# Patient Record
Sex: Female | Born: 1975 | Race: White | Hispanic: No | Smoking: Current every day smoker
Health system: Southern US, Community
[De-identification: ages and names within clinical notes are randomized; demographics above are authoritative.]

## PROBLEM LIST (undated history)

## (undated) DIAGNOSIS — I10 Essential (primary) hypertension: Secondary | ICD-10-CM

## (undated) DIAGNOSIS — N289 Disorder of kidney and ureter, unspecified: Secondary | ICD-10-CM

## (undated) DIAGNOSIS — M549 Dorsalgia, unspecified: Secondary | ICD-10-CM

## (undated) DIAGNOSIS — K219 Gastro-esophageal reflux disease without esophagitis: Secondary | ICD-10-CM

## (undated) DIAGNOSIS — G8929 Other chronic pain: Secondary | ICD-10-CM

## (undated) DIAGNOSIS — K5090 Crohn's disease, unspecified, without complications: Secondary | ICD-10-CM

## (undated) HISTORY — PX: COLECTOMY: SHX59

## (undated) HISTORY — PX: TONSILLECTOMY: SUR1361

## (undated) HISTORY — PX: COLOSTOMY: SHX63

## (undated) HISTORY — PX: TUBAL LIGATION: SHX77

## (undated) HISTORY — PX: OTHER SURGICAL HISTORY: SHX169

---

## 2012-11-12 ENCOUNTER — Emergency Department (HOSPITAL_COMMUNITY)
Admission: EM | Admit: 2012-11-12 | Discharge: 2012-11-13 | Disposition: A | Discharge status: Home / Self Care | Payer: Medicare Other | Attending: Emergency Medicine | Admitting: Emergency Medicine

## 2012-11-12 ENCOUNTER — Encounter (HOSPITAL_COMMUNITY): Payer: Self-pay

## 2012-11-12 DIAGNOSIS — R059 Cough, unspecified: Secondary | ICD-10-CM | POA: Insufficient documentation

## 2012-11-12 DIAGNOSIS — I889 Nonspecific lymphadenitis, unspecified: Secondary | ICD-10-CM

## 2012-11-12 DIAGNOSIS — R599 Enlarged lymph nodes, unspecified: Secondary | ICD-10-CM | POA: Insufficient documentation

## 2012-11-12 DIAGNOSIS — G8929 Other chronic pain: Secondary | ICD-10-CM | POA: Insufficient documentation

## 2012-11-12 DIAGNOSIS — E876 Hypokalemia: Secondary | ICD-10-CM

## 2012-11-12 DIAGNOSIS — R05 Cough: Secondary | ICD-10-CM | POA: Insufficient documentation

## 2012-11-12 DIAGNOSIS — K219 Gastro-esophageal reflux disease without esophagitis: Secondary | ICD-10-CM | POA: Insufficient documentation

## 2012-11-12 DIAGNOSIS — F17200 Nicotine dependence, unspecified, uncomplicated: Secondary | ICD-10-CM | POA: Insufficient documentation

## 2012-11-12 DIAGNOSIS — Z79899 Other long term (current) drug therapy: Secondary | ICD-10-CM | POA: Insufficient documentation

## 2012-11-12 DIAGNOSIS — I10 Essential (primary) hypertension: Secondary | ICD-10-CM | POA: Insufficient documentation

## 2012-11-12 DIAGNOSIS — J029 Acute pharyngitis, unspecified: Secondary | ICD-10-CM | POA: Insufficient documentation

## 2012-11-12 DIAGNOSIS — M542 Cervicalgia: Secondary | ICD-10-CM | POA: Insufficient documentation

## 2012-11-12 DIAGNOSIS — Z8719 Personal history of other diseases of the digestive system: Secondary | ICD-10-CM | POA: Insufficient documentation

## 2012-11-12 HISTORY — DX: Gastro-esophageal reflux disease without esophagitis: K21.9

## 2012-11-12 HISTORY — DX: Essential (primary) hypertension: I10

## 2012-11-12 HISTORY — DX: Dorsalgia, unspecified: M54.9

## 2012-11-12 HISTORY — DX: Other chronic pain: G89.29

## 2012-11-12 HISTORY — DX: Crohn's disease, unspecified, without complications: K50.90

## 2012-11-12 MED ORDER — SODIUM CHLORIDE 0.9 % IV SOLN
INTRAVENOUS | Status: DC
  Administered 2012-11-12 – 2012-11-13 (×2): via INTRAVENOUS

## 2012-11-12 NOTE — ED Provider Notes (Signed)
CSN: [id number]     Arrival date & time 11/12/12  2315 History  This chart was scribed for Ward Givens, MD by Bennett Scrape, ED Scribe. This patient was seen in room APA06/APA06 and the patient's care was started at 11:40 PM.   Chief Complaint  Patient presents with  . Oral Swelling    The history is provided by the patient. No language interpreter was used.    HPI Comments: Samantha Page is a 37 y.o. female who presents to the Emergency Department complaining of 5 days of gradual onset, gradually worsening, constant neck pain that radiates into the left jaw with associated sore throat and bilateral neck swelling. She states it is painful to swallow and difficult to swallow. She denies ear pain. She was seen by her PCP in [place] for the same around 2 PM today and has a negative mono and strep test. She was advised that she was told that the symptoms were from lymphadenopathy from a possible abscess. She denies having known fevers at home, nausea, emesis and diarrhea. She reports a mild cough, but no rhinorrhea.    She is a 0.50 ppd smoker but denies alcohol use.   PCP is Dr. Merleen Milliner in [place]  Past Medical History  Diagnosis Date  . Back pain, chronic   . Crohn disease   . Hypertension   . Gastroesophageal reflux   colostomy per pt at bedside  History reviewed. No pertinent past surgical history.  Colostomy    No family history on file. History  Substance Use Topics  . Smoking status: Current Every Day Smoker  . Smokeless tobacco: Not on file  . Alcohol Use: No  she is on disability for chronic back pain, crohn's and anxiety Smokes 1/4 ppad  No OB history provided.  Review of Systems  Constitutional: Negative for fever.  HENT: Positive for sore throat and neck pain. Negative for rhinorrhea.   Respiratory: Positive for cough.   Gastrointestinal: Negative for nausea, vomiting and diarrhea.  All other systems reviewed and are negative.    Allergies  Review of  patient's allergies indicates no known allergies.  Home Medications   Current Outpatient Rx  Name  Route  Sig  Dispense  Refill  . ALPRAZolam (XANAX) 0.5 MG tablet   Oral   Take 0.5 mg by mouth at bedtime as needed for sleep.         . cetirizine (ZYRTEC) 10 MG tablet   Oral   Take 10 mg by mouth daily.         . fentaNYL (DURAGESIC - DOSED MCG/HR) 75 MCG/HR   Transdermal   Place 1 patch onto the skin every 3 (three) days.         . lansoprazole (PREVACID) 30 MG capsule   Oral   Take 30 mg by mouth daily.         . ondansetron (ZOFRAN) 4 MG tablet   Oral   Take 4 mg by mouth every 8 (eight) hours as needed for nausea.         . promethazine (PHENERGAN) 25 MG tablet   Oral   Take 25 mg by mouth every 6 (six) hours as needed for nausea.         Medication for Crohns that states with "CHOL" that she takes TID    Triage Vitals: BP 117/74  Pulse 119  Temp(Src) 99.9 F (37.7 C) (Oral)  Resp 20  Ht 5' (1.524 m)  Wt 189 lb (85.73  kg)  BMI 36.91 kg/m2  SpO2 99%   Vital signs normal except for tachycardia   Physical Exam  Nursing note and vitals reviewed. Constitutional: She is oriented to person, place, and time. She appears well-developed and well-nourished.  Non-toxic appearance. She does not appear ill. No distress.  HENT:  Head: Normocephalic and atraumatic.  Right Ear: External ear normal.  Left Ear: External ear normal.  Nose: Nose normal. No mucosal edema or rhinorrhea.  Mouth/Throat: Oropharynx is clear and moist and mucous membranes are normal. No dental abscesses or edematous.  No tonsils, no erythema, no edema, no asymmetry, normal phonation, no drooling  Eyes: Conjunctivae and EOM are normal. Pupils are equal, round, and reactive to light.  Neck: Normal range of motion and full passive range of motion without pain. Neck supple.    Bilateral swelling in the proximal anterior cervical chamber as noted with visible swelling of her neck   Cardiovascular: Normal rate, regular rhythm and normal heart sounds.  Exam reveals no gallop and no friction rub.   No murmur heard. Pulmonary/Chest: Effort normal and breath sounds normal. No respiratory distress. She has no wheezes. She has no rhonchi. She has no rales. She exhibits no tenderness and no crepitus.  Abdominal: Soft. Normal appearance and bowel sounds are normal. She exhibits no distension. There is no tenderness. There is no rebound and no guarding.  colostomy bag in place  Musculoskeletal: Normal range of motion. She exhibits no edema and no tenderness.  Moves all extremities well.   Neurological: She is alert and oriented to person, place, and time. She has normal strength. No cranial nerve deficit.  Skin: Skin is warm, dry and intact. No rash noted. No erythema. No pallor.  Psychiatric: She has a normal mood and affect. Her speech is normal and behavior is normal. Her mood appears not anxious.    ED Course   Medications  0.9 %  sodium chloride infusion ( Intravenous Stopped 11/13/12 0434)  traMADol (ULTRAM) tablet 100 mg (100 mg Oral Not Given 11/13/12 0434)  iohexol (OMNIPAQUE) 300 MG/ML solution 75 mL (75 mL Intravenous Contrast Given 11/13/12 0239)  ketorolac (TORADOL) 30 MG/ML injection 30 mg (30 mg Intravenous Given 11/13/12 0247)  clindamycin (CLEOCIN) IVPB 900 mg (0 mg Intravenous Stopped 11/13/12 0434)     DIAGNOSTIC STUDIES: Oxygen Saturation is 99% on room air, normal by my interpretation.    COORDINATION OF CARE: 11:46 PM-Discussed treatment plan which includes CT of neck with pt at bedside and pt agreed to plan.    Review of [place] dated today shows patient was switched to fentanyl on May 20. She was started on 25 mcg and has been increased up to 75 mcg per hour. Before that she was getting frequent hydrocodone and oxycodone prescriptions. She has 10 prescribing physicians and she has had her prescriptions filled from 4 pharmacies. Since she was started on  the fentanyl she has had no other narcotic prescriptions filled.  Patient states she does not feel her fentanyl is controlling her chronic back pain. She's advised to discuss this with her PCP who is managing her chronic back pain and whom she saw earlier today. We discussed the results of her CT scan and need to followup with ears nose and throat.  Discussing her low potassium, she states she is on a combination BP pill and it has been low with her Crohn's flare ups in the past. She states she can't take ibuprofen or naprosyn b/o her Crohn's and doesn't  like to take prednisone.   Pt told triage she has no allergies, once she was given her discharge papers she now states she has multiple allergies including tramadol which "Makes my head feel funny".  Procedures (including critical care time)  Results for orders placed during the hospital encounter of 11/12/12  CBC WITH DIFFERENTIAL      Result Value Range   WBC 12.9 (*) 4.0 - 10.5 K/uL   RBC 4.43  3.87 - 5.11 MIL/uL   Hemoglobin 12.1  12.0 - 15.0 g/dL   HCT 08.6  57.8 - 46.9 %   MCV 82.8  78.0 - 100.0 fL   MCH 27.3  26.0 - 34.0 pg   MCHC 33.0  30.0 - 36.0 g/dL   RDW 62.9 (*) 52.8 - 41.3 %   Platelets 300  150 - 400 K/uL   Neutrophils Relative % 72  43 - 77 %   Neutro Abs 9.2 (*) 1.7 - 7.7 K/uL   Lymphocytes Relative 20  12 - 46 %   Lymphs Abs 2.6  0.7 - 4.0 K/uL   Monocytes Relative 7  3 - 12 %   Monocytes Absolute 0.9  0.1 - 1.0 K/uL   Eosinophils Relative 1  0 - 5 %   Eosinophils Absolute 0.1  0.0 - 0.7 K/uL   Basophils Relative 0  0 - 1 %   Basophils Absolute 0.1  0.0 - 0.1 K/uL  BASIC METABOLIC PANEL      Result Value Range   Sodium 133 (*) 135 - 145 mEq/L   Potassium 2.8 (*) 3.5 - 5.1 mEq/L   Chloride 93 (*) 96 - 112 mEq/L   CO2 29  19 - 32 mEq/L   Glucose, Bld 93  70 - 99 mg/dL   BUN 14  6 - 23 mg/dL   Creatinine, Ser 2.44  0.50 - 1.10 mg/dL   Calcium 9.2  8.4 - 01.0 mg/dL   GFR calc non Af Amer >90  >90 mL/min   GFR  calc Af Amer >90  >90 mL/min    Laboratory interpretation all normal except hypokalemia, leukocytosis   Ct Soft Tissue Neck W Contrast  11/13/2012   *RADIOLOGY REPORT*  Clinical Data: Swollen Lymph nodes in neck  CT NECK WITH CONTRAST  Technique:  Multidetector CT imaging of the neck was performed with intravenous contrast.  Contrast: 75mL OMNIPAQUE IOHEXOL 300 MG/ML  SOLN  Comparison: None.  Findings: Visualized portions of the brain are unremarkable.  The orbits are normal.  The paranasal sinuses are clear.  The salivary glands, including the parotid glands and submandibular glands are normal.  The oral cavity, oropharynx, and nasopharynx are clear without evidence of loculated fluid collection or mass lesion. Well tonsils are mildly prominent.  The epiglottis is normal.  The larynx and hypopharynx are normal.  The thyroid is normal.  Multiple enlarged predominately level II lymph nodes are seen within the neck bilaterally.  The largest of level II on the right measures 1.9 cm in short axis.  The largest on the left level II measures 1 x 9 short axis (series 2, image 47).  There is surrounding soft tissue infiltration about these enlarged lymph nodes.  Enlarged right level III node is seen inferiorly measuring 1 cm in short axis (series 2, image 64).  Right level I minimal flow nodes measure up to 9 mm in short axis.  The mildly prominent level three node measures 7 mm in short axis.  These nodes are  of uncertain etiology, and may be reactive or infectious in nature.  The visualized portions of the superior mediastinum are unremarkable.  The lungs are clear.  Normal intravenous vascular enhancement seen.  No osseous abnormalities are identified.  IMPRESSION:  Enlarged right greater than left cervical adenopathy as detailed above, most prominent at level II bilaterally.  The level II nodes are heterogeneous with associated inflammatory fat stranding. These findings are uncertain etiology, and may be  reactive/infectious in nature.  No mass lesions are identified within the head and neck to suggest primary head neck cancer. Lymphoma could also also be considered, although the appearance of these nodes would be atypical for this etiology.  Correlation with laboratory values and / or possible histology may be helpful for further evaluation.  Recommend follow-up to resolution.   Original Report Authenticated By: Rise Mu, M.D.      1. Cervical lymphadenitis   2. Hypokalemia with normal acid-base balance     Discharge Medication List as of 11/13/2012  4:22 AM    START taking these medications   Details  clindamycin (CLEOCIN) 300 MG capsule Take 1 capsule (300 mg total) by mouth every 6 (six) hours., Starting 11/13/2012, Until Discontinued, Print    potassium chloride 20 MEQ TBCR Take 20 mEq by mouth 2 (two) times daily., Starting 11/13/2012, Until Discontinued, Print    traMADol (ULTRAM) 50 MG tablet Take 2 tablets (100 mg total) by mouth every 6 (six) hours as needed., Starting 11/13/2012, Until Discontinued, Print        Plan discharge   Devoria Albe, MD, FACEP   MDM   I personally performed the services described in this documentation, which was scribed in my presence. The recorded information has been reviewed and considered.  Devoria Albe, MD, FACEP    Ward Givens, MD 11/13/12 [phone]

## 2012-11-12 NOTE — ED Notes (Signed)
Pt states she saw her primary care doctor in [place] earlier today and states she was told to come to e.r. For evaluation possible peritonsillar abscess.  Pt has pain and swelling to left side of neck and throat.

## 2012-11-13 ENCOUNTER — Emergency Department (HOSPITAL_COMMUNITY): Payer: Medicare Other

## 2012-11-13 LAB — CBC WITH DIFFERENTIAL/PLATELET
Basophils Absolute: 0.1 10*3/uL (ref 0.0–0.1)
Basophils Relative: 0 % (ref 0–1)
Eosinophils Absolute: 0.1 10*3/uL (ref 0.0–0.7)
Eosinophils Relative: 1 % (ref 0–5)
HCT: 36.7 % (ref 36.0–46.0)
Hemoglobin: 12.1 g/dL (ref 12.0–15.0)
Lymphocytes Relative: 20 % (ref 12–46)
Lymphs Abs: 2.6 10*3/uL (ref 0.7–4.0)
MCH: 27.3 pg (ref 26.0–34.0)
MCHC: 33 g/dL (ref 30.0–36.0)
MCV: 82.8 fL (ref 78.0–100.0)
Monocytes Absolute: 0.9 10*3/uL (ref 0.1–1.0)
Monocytes Relative: 7 % (ref 3–12)
Neutro Abs: 9.2 10*3/uL — ABNORMAL HIGH (ref 1.7–7.7)
Neutrophils Relative %: 72 % (ref 43–77)
Platelets: 300 10*3/uL (ref 150–400)
RBC: 4.43 MIL/uL (ref 3.87–5.11)
RDW: 15.8 % — ABNORMAL HIGH (ref 11.5–15.5)
WBC: 12.9 10*3/uL — ABNORMAL HIGH (ref 4.0–10.5)

## 2012-11-13 LAB — BASIC METABOLIC PANEL
BUN: 14 mg/dL (ref 6–23)
CO2: 29 mEq/L (ref 19–32)
Calcium: 9.2 mg/dL (ref 8.4–10.5)
Chloride: 93 mEq/L — ABNORMAL LOW (ref 96–112)
Creatinine, Ser: 0.67 mg/dL (ref 0.50–1.10)
GFR calc Af Amer: >90 mL/min (ref >90)
GFR calc non Af Amer: >90 mL/min (ref >90)
Glucose, Bld: 93 mg/dL (ref 70–99)
Potassium: 2.8 mEq/L — ABNORMAL LOW (ref 3.5–5.1)
Sodium: 133 mEq/L — ABNORMAL LOW (ref 135–145)

## 2012-11-13 MED ORDER — CLINDAMYCIN HCL 300 MG PO CAPS: 300.0000 mg | ORAL_CAPSULE | Freq: Four times a day (QID) | ORAL | Status: DC

## 2012-11-13 MED ORDER — CLINDAMYCIN PHOSPHATE 900 MG/50ML IV SOLN
900.0000 mg | Freq: Once | INTRAVENOUS | Status: AC
  Administered 2012-11-13: 900 mg via INTRAVENOUS
  Filled 2012-11-13: qty 50

## 2012-11-13 MED ORDER — POTASSIUM CHLORIDE ER 20 MEQ PO TBCR: 20.0000 meq | EXTENDED_RELEASE_TABLET | Freq: Two times a day (BID) | ORAL | Status: DC

## 2012-11-13 MED ORDER — TRAMADOL HCL 50 MG PO TABS: 100.0000 mg | ORAL_TABLET | Freq: Once | ORAL | Status: DC

## 2012-11-13 MED ORDER — KETOROLAC TROMETHAMINE 30 MG/ML IJ SOLN
30.0000 mg | Freq: Once | INTRAMUSCULAR | Status: AC
  Administered 2012-11-13: 30 mg via INTRAVENOUS
  Filled 2012-11-13: qty 1

## 2012-11-13 MED ORDER — IOHEXOL 300 MG/ML  SOLN
75.0000 mL | Freq: Once | INTRAMUSCULAR | Status: AC | PRN
  Administered 2012-11-13: 75 mL via INTRAVENOUS

## 2012-11-13 MED ORDER — TRAMADOL HCL 50 MG PO TABS: 100.0000 mg | ORAL_TABLET | Freq: Four times a day (QID) | ORAL | Status: DC | PRN

## 2012-11-13 NOTE — ED Notes (Signed)
Spoke to this pt regarding her complaints about not getting anything for pain.  Pt reports E.R. Doctor was rude to her and that she did not question pt about her allergies.  Pt given phone number for Director of E.R. To voice complaints

## 2012-12-19 ENCOUNTER — Other Ambulatory Visit: Payer: Self-pay

## 2012-12-19 ENCOUNTER — Encounter (HOSPITAL_COMMUNITY): Payer: Self-pay | Admitting: Emergency Medicine

## 2012-12-19 ENCOUNTER — Emergency Department (HOSPITAL_COMMUNITY): Payer: Medicare Other

## 2012-12-19 ENCOUNTER — Emergency Department (HOSPITAL_COMMUNITY)
Admission: EM | Admit: 2012-12-19 | Discharge: 2012-12-20 | Disposition: A | Discharge status: Home / Self Care | Payer: Medicare Other | Attending: Emergency Medicine | Admitting: Emergency Medicine

## 2012-12-19 DIAGNOSIS — R05 Cough: Secondary | ICD-10-CM

## 2012-12-19 DIAGNOSIS — R0602 Shortness of breath: Secondary | ICD-10-CM | POA: Insufficient documentation

## 2012-12-19 DIAGNOSIS — F17200 Nicotine dependence, unspecified, uncomplicated: Secondary | ICD-10-CM | POA: Insufficient documentation

## 2012-12-19 DIAGNOSIS — Z87448 Personal history of other diseases of urinary system: Secondary | ICD-10-CM | POA: Insufficient documentation

## 2012-12-19 DIAGNOSIS — E876 Hypokalemia: Secondary | ICD-10-CM | POA: Insufficient documentation

## 2012-12-19 DIAGNOSIS — M546 Pain in thoracic spine: Secondary | ICD-10-CM | POA: Insufficient documentation

## 2012-12-19 DIAGNOSIS — Z8719 Personal history of other diseases of the digestive system: Secondary | ICD-10-CM | POA: Insufficient documentation

## 2012-12-19 DIAGNOSIS — I10 Essential (primary) hypertension: Secondary | ICD-10-CM | POA: Insufficient documentation

## 2012-12-19 DIAGNOSIS — R072 Precordial pain: Secondary | ICD-10-CM | POA: Insufficient documentation

## 2012-12-19 DIAGNOSIS — R059 Cough, unspecified: Secondary | ICD-10-CM | POA: Insufficient documentation

## 2012-12-19 DIAGNOSIS — Z79899 Other long term (current) drug therapy: Secondary | ICD-10-CM | POA: Insufficient documentation

## 2012-12-19 HISTORY — DX: Disorder of kidney and ureter, unspecified: N28.9

## 2012-12-19 LAB — URINE MICROSCOPIC-ADD ON

## 2012-12-19 LAB — CBC WITH DIFFERENTIAL/PLATELET
Basophils Absolute: 0 10*3/uL (ref 0.0–0.1)
Basophils Relative: 1 % (ref 0–1)
Eosinophils Absolute: 0.1 10*3/uL (ref 0.0–0.7)
Eosinophils Relative: 2 % (ref 0–5)
HCT: 39.3 % (ref 36.0–46.0)
Hemoglobin: 13.1 g/dL (ref 12.0–15.0)
Lymphocytes Relative: 23 % (ref 12–46)
Lymphs Abs: 1.2 10*3/uL (ref 0.7–4.0)
MCH: 27.8 pg (ref 26.0–34.0)
MCHC: 33.3 g/dL (ref 30.0–36.0)
MCV: 83.3 fL (ref 78.0–100.0)
Monocytes Absolute: 0.3 10*3/uL (ref 0.1–1.0)
Monocytes Relative: 5 % (ref 3–12)
Neutro Abs: 3.5 10*3/uL (ref 1.7–7.7)
Neutrophils Relative %: 69 % (ref 43–77)
Platelets: 240 10*3/uL (ref 150–400)
RBC: 4.72 MIL/uL (ref 3.87–5.11)
RDW: 16.7 % — ABNORMAL HIGH (ref 11.5–15.5)
WBC: 5 10*3/uL (ref 4.0–10.5)

## 2012-12-19 LAB — COMPREHENSIVE METABOLIC PANEL
ALT: 9 U/L (ref 0–35)
AST: 21 U/L (ref 0–37)
Albumin: 3.1 g/dL — ABNORMAL LOW (ref 3.5–5.2)
Alkaline Phosphatase: 28 U/L — ABNORMAL LOW (ref 39–117)
BUN: 10 mg/dL (ref 6–23)
CO2: 33 mEq/L — ABNORMAL HIGH (ref 19–32)
Calcium: 9 mg/dL (ref 8.4–10.5)
Chloride: 98 mEq/L (ref 96–112)
Creatinine, Ser: 0.77 mg/dL (ref 0.50–1.10)
GFR calc Af Amer: >90 mL/min (ref >90)
GFR calc non Af Amer: >90 mL/min (ref >90)
Glucose, Bld: 126 mg/dL — ABNORMAL HIGH (ref 70–99)
Potassium: 2.5 mEq/L — CL (ref 3.5–5.1)
Sodium: 136 mEq/L (ref 135–145)
Total Bilirubin: <0.1 mg/dL — ABNORMAL LOW (ref 0.3–1.2)
Total Protein: 7 g/dL (ref 6.0–8.3)

## 2012-12-19 LAB — URINALYSIS, ROUTINE W REFLEX MICROSCOPIC
Bilirubin Urine: NEGATIVE
Glucose, UA: NEGATIVE mg/dL
Ketones, ur: NEGATIVE mg/dL
Leukocytes, UA: NEGATIVE
Nitrite: NEGATIVE
Protein, ur: NEGATIVE mg/dL
Specific Gravity, Urine: 1.02 (ref 1.005–1.030)
Urobilinogen, UA: 0.2 mg/dL (ref 0.0–1.0)
pH: 7 (ref 5.0–8.0)

## 2012-12-19 LAB — D-DIMER, QUANTITATIVE (NOT AT ARMC): D-Dimer, Quant: 0.41 ug/mL-FEU (ref 0.00–0.48)

## 2012-12-19 MED ORDER — POTASSIUM CHLORIDE CRYS ER 20 MEQ PO TBCR: 20.0000 meq | EXTENDED_RELEASE_TABLET | Freq: Two times a day (BID) | ORAL | Status: DC

## 2012-12-19 MED ORDER — POTASSIUM CHLORIDE CRYS ER 20 MEQ PO TBCR
40.0000 meq | EXTENDED_RELEASE_TABLET | Freq: Once | ORAL | Status: AC
  Administered 2012-12-19: 40 meq via ORAL
  Filled 2012-12-19: qty 2

## 2012-12-19 MED ORDER — CEPHALEXIN 500 MG PO CAPS: 500.0000 mg | ORAL_CAPSULE | Freq: Four times a day (QID) | ORAL | Status: DC

## 2012-12-19 MED ORDER — ONDANSETRON HCL 4 MG PO TABS: 4.0000 mg | ORAL_TABLET | Freq: Four times a day (QID) | ORAL | Status: DC

## 2012-12-19 MED ORDER — DIAZEPAM 5 MG PO TABS
5.0000 mg | ORAL_TABLET | Freq: Once | ORAL | Status: AC
  Administered 2012-12-19: 5 mg via ORAL
  Filled 2012-12-19: qty 1

## 2012-12-19 MED ORDER — ONDANSETRON HCL 4 MG/2ML IJ SOLN
4.0000 mg | Freq: Once | INTRAMUSCULAR | Status: AC
  Administered 2012-12-19: 4 mg via INTRAVENOUS
  Filled 2012-12-19: qty 2

## 2012-12-19 MED ORDER — MORPHINE SULFATE 4 MG/ML IJ SOLN: 2.0000 mg | Freq: Once | INTRAMUSCULAR | Status: DC

## 2012-12-19 MED ORDER — MORPHINE SULFATE 4 MG/ML IJ SOLN
4.0000 mg | Freq: Once | INTRAMUSCULAR | Status: AC
  Administered 2012-12-19: 4 mg via INTRAVENOUS
  Filled 2012-12-19: qty 1

## 2012-12-19 MED ORDER — AZITHROMYCIN 250 MG PO TABS: ORAL_TABLET | ORAL | Status: DC

## 2012-12-19 MED ORDER — MORPHINE SULFATE 2 MG/ML IJ SOLN
2.0000 mg | Freq: Once | INTRAMUSCULAR | Status: AC
  Administered 2012-12-19: 2 mg via INTRAVENOUS
  Filled 2012-12-19: qty 1

## 2012-12-19 MED ORDER — SODIUM CHLORIDE 0.9 % IV SOLN
Freq: Once | INTRAVENOUS | Status: AC
  Administered 2012-12-19: 999 mL/h via INTRAVENOUS

## 2012-12-19 NOTE — ED Notes (Addendum)
CRITICAL LAB CALLED     K +  2.5  PA notified -  T. Triplett

## 2012-12-19 NOTE — ED Provider Notes (Signed)
CSN: [id number]     Arrival date & time 12/19/12  1957 History   First MD Initiated Contact with Patient 12/19/12 2003     Chief Complaint  Patient presents with  . Cough  . Chest Pain  . Back Pain   (Consider location/radiation/quality/duration/timing/severity/associated sxs/prior Treatment) HPI Comments: Laurice Iglesia is a 37 y.o. female who presents to the Emergency Department complaining of cough and congestion for 2-3 days.  States that her cough is productive of brown sputum and sometimes induces vomiting.   She also c/o mid back pain and chest tightness that began yesterday.  She also states she has some shortness of breath, but states that she smokes and it does not seem to be worse than usual.  Pain to her back is worse with movement and nothing improves the pain.  She states the pain feels similar to previous episode of pneumonia several years ago.  She denies fever, chills, persistent vomiting, abdominal pain or hemoptysis.     Patient is a 37 y.o. female presenting with cough, chest pain, and back pain. The history is provided by the patient.  Cough Cough characteristics:  Productive and vomit-inducing Sputum characteristics:  Manson Passey Severity:  Moderate Onset quality:  Gradual Duration:  2 days Timing:  Intermittent Progression:  Unchanged Chronicity:  New Smoker: yes   Relieved by:  Nothing Worsened by:  Nothing tried Ineffective treatments:  None tried Associated symptoms: chest pain and shortness of breath   Associated symptoms: no chills, no diaphoresis, no ear fullness, no ear pain, no fever, no headaches, no myalgias, no rash, no rhinorrhea, no sinus congestion, no sore throat and no wheezing   Associated symptoms comment:  Mid back pain that radiates across her back but worse on the right side Chest pain:    Chest pain quality: tightness.   Severity:  Moderate   Onset quality:  Gradual   Timing:  Constant   Progression:  Unchanged   Chronicity:  New Risk factors:  no recent infection and no recent travel   Chest Pain Pain location:  Substernal area Pain quality: tightness   Pain radiates to:  Mid back Pain radiates to the back: yes   Pain severity:  Moderate Onset quality:  Gradual Duration:  1 day Timing:  Constant Progression:  Unchanged Chronicity:  New Relieved by:  Nothing Worsened by:  Certain positions, coughing, deep breathing and movement Ineffective treatments:  None tried Associated symptoms: back pain, cough and shortness of breath   Associated symptoms: no abdominal pain, no anxiety, no diaphoresis, no dizziness, no dysphagia, no fatigue, no fever, no headache, no lower extremity edema, no nausea, no near-syncope, no numbness, no orthopnea, no syncope, not vomiting and no weakness   Cough:    Cough characteristics:  Productive   Sputum characteristics:  Manson Passey   Severity:  Moderate   Onset quality:  Gradual   Timing:  Intermittent   Progression:  Unchanged   Chronicity:  New Risk factors: smoking   Risk factors: no birth control   Back Pain Location:  Thoracic spine Quality:  Aching Radiates to: chest. Pain severity:  Moderate Pain is:  Same all the time Onset quality:  Gradual Timing:  Constant Progression:  Unchanged Context: not falling, not recent illness and not recent injury   Relieved by:  Nothing Worsened by:  Bending, twisting and movement Ineffective treatments:  None tried Associated symptoms: chest pain   Associated symptoms: no abdominal pain, no abdominal swelling, no bladder incontinence, no bowel incontinence,  no dysuria, no fever, no headaches, no leg pain, no numbness, no pelvic pain, no perianal numbness, no tingling and no weakness     Past Medical History  Diagnosis Date  . Back pain, chronic   . Crohn disease   . Hypertension   . Gastroesophageal reflux   . Renal disorder    Past Surgical History  Procedure Laterality Date  . Colectomy    . Tubal ligation    . Tonsillectomy     History  reviewed. No pertinent family history. History  Substance Use Topics  . Smoking status: Current Every Day Smoker  . Smokeless tobacco: Not on file  . Alcohol Use: No   OB History   Grav Para Term Preterm Abortions TAB SAB Ect Mult Living                 Review of Systems  Constitutional: Negative for fever, chills, diaphoresis, activity change, appetite change and fatigue.  HENT: Negative for ear pain, sore throat, rhinorrhea and trouble swallowing.   Respiratory: Positive for cough, chest tightness and shortness of breath. Negative for wheezing.   Cardiovascular: Positive for chest pain. Negative for orthopnea, syncope and near-syncope.  Gastrointestinal: Negative for nausea, vomiting, abdominal pain and bowel incontinence.  Genitourinary: Negative for bladder incontinence, dysuria and pelvic pain.  Musculoskeletal: Positive for back pain. Negative for myalgias.  Skin: Negative for rash.  Neurological: Negative for dizziness, tingling, weakness, numbness and headaches.  All other systems reviewed and are negative.    Allergies  Tramadol  Home Medications   Current Outpatient Rx  Name  Route  Sig  Dispense  Refill  . ALPRAZolam (XANAX) 0.5 MG tablet   Oral   Take 0.5 mg by mouth at bedtime as needed for sleep.         . cetirizine (ZYRTEC) 10 MG tablet   Oral   Take 10 mg by mouth daily.         . lansoprazole (PREVACID) 30 MG capsule   Oral   Take 30 mg by mouth daily.         . fentaNYL (DURAGESIC - DOSED MCG/HR) 75 MCG/HR   Transdermal   Place 1 patch onto the skin every 3 (three) days.          BP 128/62  Pulse 105  Temp(Src) 99 F (37.2 C) (Oral)  Resp 20  Ht 5' (1.524 m)  Wt 172 lb (78.019 kg)  BMI 33.59 kg/m2  SpO2 100%  LMP 12/19/2012 Physical Exam  Nursing note and vitals reviewed. Constitutional: She is oriented to person, place, and time. She appears well-developed and well-nourished. No distress.  HENT:  Head: Normocephalic and  atraumatic.  Mouth/Throat: Oropharynx is clear and moist.  Neck: Normal range of motion. Neck supple.  Cardiovascular: Normal rate, regular rhythm, normal heart sounds and intact distal pulses.   No murmur heard. Pulmonary/Chest: Effort normal and breath sounds normal. No respiratory distress. She has no wheezes. She has no rales. She exhibits no tenderness.  Abdominal: Soft. She exhibits no distension. There is no tenderness. There is no rebound and no guarding.  Patient has a colostomy bag in place  Musculoskeletal: Normal range of motion. She exhibits tenderness. She exhibits no edema.       Thoracic back: She exhibits tenderness. She exhibits normal range of motion, no swelling, no edema, no deformity, no laceration, no spasm and normal pulse.       Back:  ttp of the thoracic paraspinal muscles.  No spinal tenderness.  Patient able to sit upright on the stretcher w/o difficulty  Lymphadenopathy:    She has no cervical adenopathy.  Neurological: She is alert and oriented to person, place, and time. She exhibits normal muscle tone. Coordination normal.  Skin: Skin is warm and dry.    ED Course  Procedures (including critical care time) Labs Review Results for orders placed during the hospital encounter of 12/19/12  CBC WITH DIFFERENTIAL      Result Value Range   WBC 5.0  4.0 - 10.5 K/uL   RBC 4.72  3.87 - 5.11 MIL/uL   Hemoglobin 13.1  12.0 - 15.0 g/dL   HCT 16.1  09.6 - 04.5 %   MCV 83.3  78.0 - 100.0 fL   MCH 27.8  26.0 - 34.0 pg   MCHC 33.3  30.0 - 36.0 g/dL   RDW 40.9 (*) 81.1 - 91.4 %   Platelets 240  150 - 400 K/uL   Neutrophils Relative % 69  43 - 77 %   Neutro Abs 3.5  1.7 - 7.7 K/uL   Lymphocytes Relative 23  12 - 46 %   Lymphs Abs 1.2  0.7 - 4.0 K/uL   Monocytes Relative 5  3 - 12 %   Monocytes Absolute 0.3  0.1 - 1.0 K/uL   Eosinophils Relative 2  0 - 5 %   Eosinophils Absolute 0.1  0.0 - 0.7 K/uL   Basophils Relative 1  0 - 1 %   Basophils Absolute 0.0  0.0 -  0.1 K/uL  COMPREHENSIVE METABOLIC PANEL      Result Value Range   Sodium 136  135 - 145 mEq/L   Potassium 2.5 (*) 3.5 - 5.1 mEq/L   Chloride 98  96 - 112 mEq/L   CO2 33 (*) 19 - 32 mEq/L   Glucose, Bld 126 (*) 70 - 99 mg/dL   BUN 10  6 - 23 mg/dL   Creatinine, Ser 7.82  0.50 - 1.10 mg/dL   Calcium 9.0  8.4 - 95.6 mg/dL   Total Protein 7.0  6.0 - 8.3 g/dL   Albumin 3.1 (*) 3.5 - 5.2 g/dL   AST 21  0 - 37 U/L   ALT 9  0 - 35 U/L   Alkaline Phosphatase 28 (*) 39 - 117 U/L   Total Bilirubin <0.1 (*) 0.3 - 1.2 mg/dL   GFR calc non Af Amer >90  >90 mL/min   GFR calc Af Amer >90  >90 mL/min  URINALYSIS, ROUTINE W REFLEX MICROSCOPIC      Result Value Range   Color, Urine YELLOW  YELLOW   APPearance CLEAR  CLEAR   Specific Gravity, Urine 1.020  1.005 - 1.030   pH 7.0  5.0 - 8.0   Glucose, UA NEGATIVE  NEGATIVE mg/dL   Hgb urine dipstick LARGE (*) NEGATIVE   Bilirubin Urine NEGATIVE  NEGATIVE   Ketones, ur NEGATIVE  NEGATIVE mg/dL   Protein, ur NEGATIVE  NEGATIVE mg/dL   Urobilinogen, UA 0.2  0.0 - 1.0 mg/dL   Nitrite NEGATIVE  NEGATIVE   Leukocytes, UA NEGATIVE  NEGATIVE  D-DIMER, QUANTITATIVE      Result Value Range   D-Dimer, Quant 0.41  0.00 - 0.48 ug/mL-FEU  URINE MICROSCOPIC-ADD ON      Result Value Range   Squamous Epithelial / LPF MANY (*) RARE   WBC, UA 0-2  <3 WBC/hpf   RBC / HPF 0-2  <3 RBC/hpf  Bacteria, UA FEW (*) RARE    Imaging Review Dg Chest 2 View  12/19/2012   CLINICAL DATA:  Cough and chest pain  EXAM: CHEST  2 VIEW  COMPARISON:  July 01, 2008  FINDINGS: Lungs are clear. Heart size and pulmonary vascularity are normal. No adenopathy. No bone lesions. No pneumothorax.  IMPRESSION: No edema or consolidation.   Electronically Signed   By: Bretta Bang   On: 12/19/2012 21:25    MDM   Patient wearing a fentanyl patch to abdomen.  States the patch is due to be removed this evening. Patient is non-toxic appearing.  Mucous membranes are moist.  No coughing  during my exam.    Date: 12/19/2012  Rate: 76  Rhythm: sinus arrhythmia  QRS Axis: normal  Intervals: normal  ST/T Wave abnormalities: normal  Conduction Disutrbances:none  Narrative Interpretation:   Old EKG Reviewed: none available    EKG read by Dr. Estell Harpin.   Labs and x-ray were reviewed with the patient. Patient hx, labs and care plan discussed with Dr. Estell Harpin prior to discharge  Patient is feeling better and requesting discharge.  VSS.   Hx of hypokalemia secondary to HCTZ and chronic diarrhea, patient not compliant with taking her potassium because she states it makes her vomit.  Has appt with her PMD in [place], [place] on Friday.  I will prescribe potassium, zofran , and z-pack.  Vitals stable, she is non-toxic appearing and stable for discharge.    Micaila Ziemba L. Trisha Mangle, PA-C 12/19/12 2321

## 2012-12-19 NOTE — ED Notes (Signed)
Pt c/o upper mid back pain, chest pain, cough, and nausea x 2 days.

## 2012-12-20 NOTE — ED Provider Notes (Signed)
Medical screening examination/treatment/procedure(s) were performed by non-physician practitioner and as supervising physician I was immediately available for consultation/collaboration.   Diontay Rosencrans L Zynia Wojtowicz, MD 12/20/12 1415 

## 2013-03-06 ENCOUNTER — Emergency Department (HOSPITAL_COMMUNITY)
Admission: EM | Admit: 2013-03-06 | Discharge: 2013-03-06 | Disposition: A | Discharge status: Home / Self Care | Payer: Medicare Other | Attending: Emergency Medicine | Admitting: Emergency Medicine

## 2013-03-06 ENCOUNTER — Encounter (HOSPITAL_COMMUNITY): Payer: Self-pay | Admitting: Emergency Medicine

## 2013-03-06 DIAGNOSIS — G8929 Other chronic pain: Secondary | ICD-10-CM | POA: Insufficient documentation

## 2013-03-06 DIAGNOSIS — Z79899 Other long term (current) drug therapy: Secondary | ICD-10-CM | POA: Insufficient documentation

## 2013-03-06 DIAGNOSIS — R109 Unspecified abdominal pain: Secondary | ICD-10-CM | POA: Insufficient documentation

## 2013-03-06 DIAGNOSIS — Z9851 Tubal ligation status: Secondary | ICD-10-CM | POA: Insufficient documentation

## 2013-03-06 DIAGNOSIS — K219 Gastro-esophageal reflux disease without esophagitis: Secondary | ICD-10-CM | POA: Insufficient documentation

## 2013-03-06 DIAGNOSIS — IMO0002 Reserved for concepts with insufficient information to code with codable children: Secondary | ICD-10-CM | POA: Insufficient documentation

## 2013-03-06 DIAGNOSIS — K5090 Crohn's disease, unspecified, without complications: Secondary | ICD-10-CM | POA: Insufficient documentation

## 2013-03-06 DIAGNOSIS — Z87448 Personal history of other diseases of urinary system: Secondary | ICD-10-CM | POA: Insufficient documentation

## 2013-03-06 DIAGNOSIS — I10 Essential (primary) hypertension: Secondary | ICD-10-CM | POA: Insufficient documentation

## 2013-03-06 DIAGNOSIS — F17200 Nicotine dependence, unspecified, uncomplicated: Secondary | ICD-10-CM | POA: Insufficient documentation

## 2013-03-06 DIAGNOSIS — M5431 Sciatica, right side: Secondary | ICD-10-CM

## 2013-03-06 DIAGNOSIS — M5430 Sciatica, unspecified side: Secondary | ICD-10-CM | POA: Insufficient documentation

## 2013-03-06 DIAGNOSIS — Z3202 Encounter for pregnancy test, result negative: Secondary | ICD-10-CM | POA: Insufficient documentation

## 2013-03-06 LAB — URINALYSIS, ROUTINE W REFLEX MICROSCOPIC
Bilirubin Urine: NEGATIVE
Glucose, UA: NEGATIVE mg/dL
Hgb urine dipstick: NEGATIVE
Ketones, ur: NEGATIVE mg/dL
Nitrite: NEGATIVE
Protein, ur: NEGATIVE mg/dL
Specific Gravity, Urine: 1.015 (ref 1.005–1.030)
Urobilinogen, UA: 0.2 mg/dL (ref 0.0–1.0)
pH: 7.5 (ref 5.0–8.0)

## 2013-03-06 LAB — URINE MICROSCOPIC-ADD ON

## 2013-03-06 LAB — PREGNANCY, URINE: Preg Test, Ur: NEGATIVE

## 2013-03-06 MED ORDER — FENTANYL CITRATE 0.05 MG/ML IJ SOLN
50.0000 ug | Freq: Once | INTRAMUSCULAR | Status: AC
  Administered 2013-03-06: 50 ug via INTRAMUSCULAR
  Filled 2013-03-06: qty 2

## 2013-03-06 MED ORDER — PREDNISONE 10 MG PO TABS: 20.0000 mg | ORAL_TABLET | Freq: Every day | ORAL | Status: DC

## 2013-03-06 MED ORDER — MORPHINE SULFATE 4 MG/ML IJ SOLN
4.0000 mg | Freq: Once | INTRAMUSCULAR | Status: AC
  Administered 2013-03-06: 4 mg via INTRAMUSCULAR
  Filled 2013-03-06: qty 1

## 2013-03-06 MED ORDER — DIAZEPAM 5 MG PO TABS
10.0000 mg | ORAL_TABLET | Freq: Once | ORAL | Status: AC
  Administered 2013-03-06: 10 mg via ORAL
  Filled 2013-03-06: qty 2

## 2013-03-06 MED ORDER — HYDROCODONE-ACETAMINOPHEN 5-325 MG PO TABS: 1.0000 | ORAL_TABLET | ORAL | Status: DC | PRN
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
## 2013-03-06 NOTE — ED Notes (Signed)
Pt c/o rt lower back pain and rt hip pain since yesterday.

## 2013-03-13 NOTE — ED Provider Notes (Signed)
Medical screening examination/treatment/procedure(s) were performed by non-physician practitioner and as supervising physician I was immediately available for consultation/collaboration.  EKG Interpretation   None         Lulamae Skorupski W. Landen Breeland, MD 03/13/13 0050 

## 2013-05-03 ENCOUNTER — Encounter (HOSPITAL_COMMUNITY): Payer: Self-pay | Admitting: Emergency Medicine

## 2013-05-03 ENCOUNTER — Emergency Department (HOSPITAL_COMMUNITY)
Admission: EM | Admit: 2013-05-03 | Discharge: 2013-05-04 | Disposition: A | Discharge status: Home / Self Care | Payer: Medicare Other | Attending: Emergency Medicine | Admitting: Emergency Medicine

## 2013-05-03 ENCOUNTER — Emergency Department (HOSPITAL_COMMUNITY): Payer: Medicare Other

## 2013-05-03 DIAGNOSIS — R059 Cough, unspecified: Secondary | ICD-10-CM | POA: Insufficient documentation

## 2013-05-03 DIAGNOSIS — R109 Unspecified abdominal pain: Secondary | ICD-10-CM

## 2013-05-03 DIAGNOSIS — I10 Essential (primary) hypertension: Secondary | ICD-10-CM | POA: Insufficient documentation

## 2013-05-03 DIAGNOSIS — R21 Rash and other nonspecific skin eruption: Secondary | ICD-10-CM | POA: Insufficient documentation

## 2013-05-03 DIAGNOSIS — K5090 Crohn's disease, unspecified, without complications: Secondary | ICD-10-CM | POA: Insufficient documentation

## 2013-05-03 DIAGNOSIS — M549 Dorsalgia, unspecified: Secondary | ICD-10-CM | POA: Insufficient documentation

## 2013-05-03 DIAGNOSIS — R05 Cough: Secondary | ICD-10-CM | POA: Insufficient documentation

## 2013-05-03 DIAGNOSIS — R1012 Left upper quadrant pain: Secondary | ICD-10-CM | POA: Insufficient documentation

## 2013-05-03 DIAGNOSIS — R112 Nausea with vomiting, unspecified: Secondary | ICD-10-CM

## 2013-05-03 DIAGNOSIS — F17200 Nicotine dependence, unspecified, uncomplicated: Secondary | ICD-10-CM | POA: Insufficient documentation

## 2013-05-03 DIAGNOSIS — N289 Disorder of kidney and ureter, unspecified: Secondary | ICD-10-CM | POA: Insufficient documentation

## 2013-05-03 DIAGNOSIS — Z79899 Other long term (current) drug therapy: Secondary | ICD-10-CM | POA: Insufficient documentation

## 2013-05-03 DIAGNOSIS — Z9049 Acquired absence of other specified parts of digestive tract: Secondary | ICD-10-CM | POA: Insufficient documentation

## 2013-05-03 DIAGNOSIS — K219 Gastro-esophageal reflux disease without esophagitis: Secondary | ICD-10-CM | POA: Insufficient documentation

## 2013-05-03 DIAGNOSIS — Z9851 Tubal ligation status: Secondary | ICD-10-CM | POA: Insufficient documentation

## 2013-05-03 DIAGNOSIS — R51 Headache: Secondary | ICD-10-CM | POA: Insufficient documentation

## 2013-05-03 DIAGNOSIS — G8929 Other chronic pain: Secondary | ICD-10-CM | POA: Insufficient documentation

## 2013-05-03 LAB — CBC WITH DIFFERENTIAL/PLATELET
Basophils Absolute: 0 10*3/uL (ref 0.0–0.1)
Basophils Relative: 0 % (ref 0–1)
Eosinophils Absolute: 0.3 10*3/uL (ref 0.0–0.7)
Eosinophils Relative: 2 % (ref 0–5)
HCT: 38.8 % (ref 36.0–46.0)
Hemoglobin: 12.9 g/dL (ref 12.0–15.0)
Lymphocytes Relative: 30 % (ref 12–46)
Lymphs Abs: 3.8 10*3/uL (ref 0.7–4.0)
MCH: 28.5 pg (ref 26.0–34.0)
MCHC: 33.2 g/dL (ref 30.0–36.0)
MCV: 85.7 fL (ref 78.0–100.0)
Monocytes Absolute: 0.6 10*3/uL (ref 0.1–1.0)
Monocytes Relative: 4 % (ref 3–12)
Neutro Abs: 8.3 10*3/uL — ABNORMAL HIGH (ref 1.7–7.7)
Neutrophils Relative %: 64 % (ref 43–77)
Platelets: 332 10*3/uL (ref 150–400)
RBC: 4.53 MIL/uL (ref 3.87–5.11)
RDW: 15.9 % — ABNORMAL HIGH (ref 11.5–15.5)
WBC: 12.9 10*3/uL — ABNORMAL HIGH (ref 4.0–10.5)

## 2013-05-03 LAB — COMPREHENSIVE METABOLIC PANEL
ALT: 9 U/L (ref 0–35)
AST: 10 U/L (ref 0–37)
Albumin: 3.2 g/dL — ABNORMAL LOW (ref 3.5–5.2)
Alkaline Phosphatase: 37 U/L — ABNORMAL LOW (ref 39–117)
BUN: 16 mg/dL (ref 6–23)
CO2: 28 mEq/L (ref 19–32)
Calcium: 9 mg/dL (ref 8.4–10.5)
Chloride: 101 mEq/L (ref 96–112)
Creatinine, Ser: 0.87 mg/dL (ref 0.50–1.10)
GFR calc Af Amer: >90 mL/min (ref >90)
GFR calc non Af Amer: 84 mL/min — ABNORMAL LOW (ref >90)
Glucose, Bld: 91 mg/dL (ref 70–99)
Potassium: 3.4 mEq/L — ABNORMAL LOW (ref 3.7–5.3)
Sodium: 140 mEq/L (ref 137–147)
Total Bilirubin: <0.2 mg/dL — ABNORMAL LOW (ref 0.3–1.2)
Total Protein: 7.2 g/dL (ref 6.0–8.3)

## 2013-05-03 LAB — LIPASE, BLOOD: Lipase: 44 U/L (ref 11–59)

## 2013-05-03 MED ORDER — IOHEXOL 300 MG/ML  SOLN
50.0000 mL | Freq: Once | INTRAMUSCULAR | Status: AC | PRN
  Administered 2013-05-03: 50 mL via ORAL

## 2013-05-03 MED ORDER — ONDANSETRON HCL 4 MG/2ML IJ SOLN
4.0000 mg | Freq: Once | INTRAMUSCULAR | Status: AC
  Administered 2013-05-03: 4 mg via INTRAVENOUS
  Filled 2013-05-03: qty 2

## 2013-05-03 MED ORDER — ONDANSETRON HCL 4 MG/2ML IJ SOLN
INTRAMUSCULAR | Status: AC
  Administered 2013-05-03: 4 mg via INTRAVENOUS
  Filled 2013-05-03: qty 2

## 2013-05-03 MED ORDER — HYDROMORPHONE HCL PF 1 MG/ML IJ SOLN
1.0000 mg | Freq: Once | INTRAMUSCULAR | Status: AC
Start: 2013-05-03 — End: 2013-05-03
  Administered 2013-05-03: 1 mg via INTRAVENOUS
  Filled 2013-05-03: qty 1

## 2013-05-03 MED ORDER — SODIUM CHLORIDE 0.9 % IV SOLN: INTRAVENOUS | Status: DC

## 2013-05-03 MED ORDER — IOHEXOL 300 MG/ML  SOLN
100.0000 mL | Freq: Once | INTRAMUSCULAR | Status: AC | PRN
  Administered 2013-05-03: 100 mL via INTRAVENOUS

## 2013-05-03 MED ORDER — ONDANSETRON HCL 4 MG/2ML IJ SOLN
4.0000 mg | Freq: Once | INTRAMUSCULAR | Status: AC
  Administered 2013-05-03: 4 mg via INTRAVENOUS

## 2013-05-03 MED ORDER — PROMETHAZINE HCL 25 MG/ML IJ SOLN
12.5000 mg | Freq: Once | INTRAMUSCULAR | Status: AC
  Administered 2013-05-03: 12.5 mg via INTRAVENOUS

## 2013-05-03 MED ORDER — HYDROCODONE-ACETAMINOPHEN 5-325 MG PO TABS: 1.0000 | ORAL_TABLET | Freq: Four times a day (QID) | ORAL | Status: DC | PRN

## 2013-05-03 MED ORDER — SODIUM CHLORIDE 0.9 % IV BOLUS (SEPSIS)
500.0000 mL | Freq: Once | INTRAVENOUS | Status: AC
  Administered 2013-05-03: 500 mL via INTRAVENOUS

## 2013-05-03 MED ORDER — PROMETHAZINE HCL 25 MG/ML IJ SOLN
INTRAMUSCULAR | Status: AC
  Filled 2013-05-03: qty 1

## 2013-05-03 MED ORDER — PROMETHAZINE HCL 25 MG PO TABS: 25.0000 mg | ORAL_TABLET | Freq: Four times a day (QID) | ORAL | Status: DC | PRN

## 2013-05-03 NOTE — ED Notes (Signed)
Patient vomiting after CT scan.  Notified Dr. Deretha Emory.  Phenergan ordered.

## 2013-05-03 NOTE — Discharge Instructions (Signed)
Abdominal Pain, Women °Abdominal (stomach, pelvic, or belly) pain can be caused by many things. It is important to tell your doctor: °· The location of the pain. °· Does it come and go or is it present all the time? °· Are there things that start the pain (eating certain foods, exercise)? °· Are there other symptoms associated with the pain (fever, nausea, vomiting, diarrhea)? °All of this is helpful to know when trying to find the cause of the pain. °CAUSES  °· Stomach: virus or bacteria infection, or ulcer. °· Intestine: appendicitis (inflamed appendix), regional ileitis (Crohn's disease), ulcerative colitis (inflamed colon), irritable bowel syndrome, diverticulitis (inflamed diverticulum of the colon), or cancer of the stomach or intestine. °· Gallbladder disease or stones in the gallbladder. °· Kidney disease, kidney stones, or infection. °· Pancreas infection or cancer. °· Fibromyalgia (pain disorder). °· Diseases of the female organs: °· Uterus: fibroid (non-cancerous) tumors or infection. °· Fallopian tubes: infection or tubal pregnancy. °· Ovary: cysts or tumors. °· Pelvic adhesions (scar tissue). °· Endometriosis (uterus lining tissue growing in the pelvis and on the pelvic organs). °· Pelvic congestion syndrome (female organs filling up with blood just before the menstrual period). °· Pain with the menstrual period. °· Pain with ovulation (producing an egg). °· Pain with an IUD (intrauterine device, birth control) in the uterus. °· Cancer of the female organs. °· Functional pain (pain not caused by a disease, may improve without treatment). °· Psychological pain. °· Depression. °DIAGNOSIS  °Your doctor will decide the seriousness of your pain by doing an examination. °· Blood tests. °· X-rays. °· Ultrasound. °· CT scan (computed tomography, special type of X-ray). °· MRI (magnetic resonance imaging). °· Cultures, for infection. °· Barium enema (dye inserted in the large intestine, to better view it with  X-rays). °· Colonoscopy (looking in intestine with a lighted tube). °· Laparoscopy (minor surgery, looking in abdomen with a lighted tube). °· Major abdominal exploratory surgery (looking in abdomen with a large incision). °TREATMENT  °The treatment will depend on the cause of the pain.  °· Many cases can be observed and treated at home. °· Over-the-counter medicines recommended by your caregiver. °· Prescription medicine. °· Antibiotics, for infection. °· Birth control pills, for painful periods or for ovulation pain. °· Hormone treatment, for endometriosis. °· Nerve blocking injections. °· Physical therapy. °· Antidepressants. °· Counseling with a psychologist or psychiatrist. °· Minor or major surgery. °HOME CARE INSTRUCTIONS  °· Do not take laxatives, unless directed by your caregiver. °· Take over-the-counter pain medicine only if ordered by your caregiver. Do not take aspirin because it can cause an upset stomach or bleeding. °· Try a clear liquid diet (broth or water) as ordered by your caregiver. Slowly move to a bland diet, as tolerated, if the pain is related to the stomach or intestine. °· Have a thermometer and take your temperature several times a day, and record it. °· Bed rest and sleep, if it helps the pain. °· Avoid sexual intercourse, if it causes pain. °· Avoid stressful situations. °· Keep your follow-up appointments and tests, as your caregiver orders. °· If the pain does not go away with medicine or surgery, you may try: °· Acupuncture. °· Relaxation exercises (yoga, meditation). °· Group therapy. °· Counseling. °SEEK MEDICAL CARE IF:  °· You notice certain foods cause stomach pain. °· Your home care treatment is not helping your pain. °· You need stronger pain medicine. °· You want your IUD removed. °· You feel faint or   lightheaded.  You develop nausea and vomiting.  You develop a rash.  You are having side effects or an allergy to your medicine. SEEK IMMEDIATE MEDICAL CARE IF:   Your  pain does not go away or gets worse.  You have a fever.  Your pain is felt only in portions of the abdomen. The right side could possibly be appendicitis. The left lower portion of the abdomen could be colitis or diverticulitis.  You are passing blood in your stools (bright red or black tarry stools, with or without vomiting).  You have blood in your urine.  You develop chills, with or without a fever.  You pass out. MAKE SURE YOU:   Understand these instructions.  Will watch your condition.  Will get help right away if you are not doing well or get worse. Document Released: 01/09/2007 Document Revised: 06/06/2011 Document Reviewed: 01/29/2009 [facility] Patient Information 2014 [employer], [place].  CT scan showed no flare of your Crohn's disease. Take pain medicine as directed take antinausea medicine as directed. Return for any new or worse symptoms. Followup with your doctors in the [place] area.

## 2013-05-03 NOTE — ED Notes (Signed)
Patient reports nausea, vomiting, and abdominal pain x 3 days.

## 2013-05-03 NOTE — ED Provider Notes (Signed)
CSN: [id number]     Arrival date & time 05/03/13  1948 History  This chart was scribed for Shelda Jakes, MD by Leone Payor, ED Scribe. This patient was seen in room APA19/[facility] and the patient's care was started 8:38 PM.    Chief Complaint  Patient presents with  . Emesis  . Abdominal Pain    The history is provided by the patient. No language interpreter was used.  HPI Comments: Samantha Page is a 38 y.o. female with past medical history of Crohn's disease, GERD who presents to the Emergency Department complaining of 3 days of constant abdominal pain that moves around and radiates to the back. She reports associated nausea and vomiting. She rates the pain as 7/10 currently. She has an ileostomy and reports noticing slight blood and pus in the bag. She denies diarrhea.   Past Medical History  Diagnosis Date  . Back pain, chronic   . Crohn disease   . Hypertension   . Gastroesophageal reflux   . Renal disorder    Past Surgical History  Procedure Laterality Date  . Colectomy    . Tubal ligation    . Tonsillectomy     History reviewed. No pertinent family history. History  Substance Use Topics  . Smoking status: Current Every Day Smoker  . Smokeless tobacco: Not on file  . Alcohol Use: No   OB History   Grav Para Term Preterm Abortions TAB SAB Ect Mult Living                 Review of Systems  Constitutional: Negative for fever and chills.  HENT: Negative for rhinorrhea and sore throat.   Eyes: Negative for visual disturbance.  Respiratory: Positive for cough. Negative for shortness of breath.   Cardiovascular: Negative for chest pain and leg swelling.  Gastrointestinal: Positive for nausea, vomiting and abdominal pain. Negative for diarrhea.  Genitourinary: Negative for dysuria.  Musculoskeletal: Positive for back pain. Negative for neck pain.  Skin: Positive for rash.  Neurological: Positive for headaches.  Hematological: Does not bruise/bleed easily.   Psychiatric/Behavioral: Negative for confusion.    Allergies  Darvocet; Ibuprofen; Remicade; Tramadol; Erythromycin; and Sulfa antibiotics  Home Medications   Current Outpatient Rx  Name  Route  Sig  Dispense  Refill  . ALPRAZolam (XANAX) 0.5 MG tablet   Oral   Take 0.5 mg by mouth at bedtime as needed for sleep.         . cyclobenzaprine (FLEXERIL) 10 MG tablet   Oral   Take 10 mg by mouth 3 (three) times daily as needed for muscle spasms.         Marland Kitchen dextroamphetamine (DEXEDRINE SPANSULE) 15 MG 24 hr capsule   Oral   Take 30 mg by mouth daily.         . diphenoxylate-atropine (LOMOTIL) 2.5-0.025 MG per tablet   Oral   Take by mouth daily as needed for diarrhea or loose stools.          . lansoprazole (PREVACID) 30 MG capsule   Oral   Take 30 mg by mouth daily.         . ondansetron (ZOFRAN) 4 MG tablet   Oral   Take 1 tablet (4 mg total) by mouth every 6 (six) hours.   12 tablet   0   . potassium chloride SA (K-DUR,KLOR-CON) 20 MEQ tablet   Oral   Take 20 mEq by mouth daily.         Marland Kitchen  balsalazide (COLAZAL) 750 MG capsule   Oral   Take 2,250 mg by mouth 2 (two) times daily.         . fentaNYL (DURAGESIC - DOSED MCG/HR) 50 MCG/HR   Transdermal   Place 1 patch onto the skin every 3 (three) days.         . hydrochlorothiazide (HYDRODIURIL) 25 MG tablet   Oral   Take 25 mg by mouth daily.         Marland Kitchen. HYDROcodone-acetaminophen (NORCO/VICODIN) 5-325 MG per tablet   Oral   Take 1-2 tablets by mouth every 6 (six) hours as needed for moderate pain.   20 tablet   0   . promethazine (PHENERGAN) 25 MG tablet   Oral   Take 1 tablet (25 mg total) by mouth every 6 (six) hours as needed for nausea or vomiting.   12 tablet   1    BP 127/80  Pulse 83  Temp(Src) 98.4 F (36.9 C) (Oral)  Resp 18  Ht 5' (1.524 m)  Wt 183 lb (83.008 kg)  BMI 35.74 kg/m2  SpO2 100%  LMP 04/10/2013 Physical Exam  Nursing note and vitals reviewed. Constitutional:  She is oriented to person, place, and time. She appears well-developed and well-nourished.  HENT:  Head: Normocephalic and atraumatic.  Cardiovascular: Normal rate, regular rhythm and normal heart sounds.   Pulmonary/Chest: Effort normal and breath sounds normal. No respiratory distress. She has no wheezes. She has no rales. She exhibits no tenderness.  Abdominal: Soft. Bowel sounds are normal. She exhibits no distension. There is tenderness (Tender in LUQ).  Neurological: She is alert and oriented to person, place, and time.  Skin: Skin is warm and dry.  Psychiatric: She has a normal mood and affect.    ED Course  Procedures (including critical care time)  DIAGNOSTIC STUDIES: Oxygen Saturation is 100% on RA, normal by my interpretation.    COORDINATION OF CARE: 9:05 PM Discussed treatment plan with pt at bedside and pt agreed to plan.   Labs Review Labs Reviewed  COMPREHENSIVE METABOLIC PANEL - Abnormal; Notable for the following:    Potassium 3.4 (*)    Albumin 3.2 (*)    Alkaline Phosphatase 37 (*)    Total Bilirubin <0.2 (*)    GFR calc non Af Amer 84 (*)    All other components within normal limits  CBC WITH DIFFERENTIAL - Abnormal; Notable for the following:    WBC 12.9 (*)    RDW 15.9 (*)    Neutro Abs 8.3 (*)    All other components within normal limits  LIPASE, BLOOD   Results for orders placed during the hospital encounter of 05/03/13  COMPREHENSIVE METABOLIC PANEL      Result Value Range   Sodium 140  137 - 147 mEq/L   Potassium 3.4 (*) 3.7 - 5.3 mEq/L   Chloride 101  96 - 112 mEq/L   CO2 28  19 - 32 mEq/L   Glucose, Bld 91  70 - 99 mg/dL   BUN 16  6 - 23 mg/dL   Creatinine, Ser 4.090.87  0.50 - 1.10 mg/dL   Calcium 9.0  8.4 - 81.110.5 mg/dL   Total Protein 7.2  6.0 - 8.3 g/dL   Albumin 3.2 (*) 3.5 - 5.2 g/dL   AST 10  0 - 37 U/L   ALT 9  0 - 35 U/L   Alkaline Phosphatase 37 (*) 39 - 117 U/L   Total Bilirubin <0.2 (*) 0.3 -  1.2 mg/dL   GFR calc non Af Amer 84  (*) >90 mL/min   GFR calc Af Amer >90  >90 mL/min  LIPASE, BLOOD      Result Value Range   Lipase 44  11 - 59 U/L  CBC WITH DIFFERENTIAL      Result Value Range   WBC 12.9 (*) 4.0 - 10.5 K/uL   RBC 4.53  3.87 - 5.11 MIL/uL   Hemoglobin 12.9  12.0 - 15.0 g/dL   HCT 81.1  91.4 - 78.2 %   MCV 85.7  78.0 - 100.0 fL   MCH 28.5  26.0 - 34.0 pg   MCHC 33.2  30.0 - 36.0 g/dL   RDW 95.6 (*) 21.3 - 08.6 %   Platelets 332  150 - 400 K/uL   Neutrophils Relative % 64  43 - 77 %   Neutro Abs 8.3 (*) 1.7 - 7.7 K/uL   Lymphocytes Relative 30  12 - 46 %   Lymphs Abs 3.8  0.7 - 4.0 K/uL   Monocytes Relative 4  3 - 12 %   Monocytes Absolute 0.6  0.1 - 1.0 K/uL   Eosinophils Relative 2  0 - 5 %   Eosinophils Absolute 0.3  0.0 - 0.7 K/uL   Basophils Relative 0  0 - 1 %   Basophils Absolute 0.0  0.0 - 0.1 K/uL    Imaging Review Ct Abdomen Pelvis W Contrast  05/03/2013   CLINICAL DATA:  38 year old female with Crohn disease. Abdominal pain radiating to the back nausea and vomiting. Ileostomy with abnormal drainage. Initial encounter.  EXAM: CT ABDOMEN AND PELVIS WITH CONTRAST  TECHNIQUE: Multidetector CT imaging of the abdomen and pelvis was performed using the standard protocol following bolus administration of intravenous contrast.  CONTRAST:  42mL OMNIPAQUE IOHEXOL 300 MG/ML SOLN, OMNIPAQUE IOHEXOL 300 MG/ML SOLN  COMPARISON:  05/19/2009 and earlier.  FINDINGS: Negative lung bases.  No pericardial or pleural effusion.  No acute osseous abnormality identified.  Small volume pelvic free fluid appears to be in the cul-de-sac. 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 The lower uterine segment is indistinct. At neck is a grossly normal. Bladder completely decompressed.  Interval right abdominal ostomy. The right colon remains in place and is partially herniated into the ostomy site (series 2, image 44), or perhaps the distal ileum ostomy was created with preservation of the native terminal ileum. Gas  and stool in the residual colon. No dilated small bowel. Mildly to moderately distended stomach with contrast and food debris. No thick walled or inflamed small bowel identified.  Negative liver, gallbladder, spleen, pancreas or adrenal glands. Portal venous system within normal limits. Negative kidneys. No lymphadenopathy.  IMPRESSION: 1. Interval subtotal colectomy and right abdominal ileostomy. 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 Clinical correlation recommended. 2. No bowel obstruction. No inflamed small bowel or abdominal free fluid. 3. Indistinct lower uterine segment with small volume cul-de-sac fluid. This could be physiologic. 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 There was distal colon inflammatory bowel disease in 2011.   Electronically Signed   By: Augusto Gamble M.D.   On: 05/03/2013 23:04    EKG Interpretation   None       MDM   1. Abdominal pain   2. Nausea & vomiting    History of Crohn's disease with CT scan today shows no evidence of a an exacerbation of Crohn's no significant inflammatory response in the abdomen. Will treat for  nausea and vomiting with Phenergan and for the abdominal pain we'll treat with hydrocodone. Labs without significant abnormalities mild leukocytosis at 12.9. No significant anemia no significant electrolyte abnormalities. Patient's potassium was 3.4 is markedly improved compared with been in the past. Patient is followed in the [place] area. Patient will return for any new or worse symptoms.    I personally performed the services described in this documentation, which was scribed in my presence. The recorded information has been reviewed and is accurate.    Shelda Jakes, MD 05/03/13 [phone]

## 2013-05-04 NOTE — ED Notes (Signed)
Patient with no complaints at this time. Respirations even and unlabored. Skin warm/dry. Discharge instructions reviewed with patient at this time. Patient given opportunity to voice concerns/ask questions. IV removed per policy and band-aid applied to site. Patient discharged at this time and left Emergency Department with steady gait.  

## 2013-05-26 ENCOUNTER — Emergency Department (HOSPITAL_COMMUNITY)
Admission: EM | Admit: 2013-05-26 | Discharge: 2013-05-26 | Disposition: A | Discharge status: Home / Self Care | Payer: Medicare Other | Attending: Emergency Medicine | Admitting: Emergency Medicine

## 2013-05-26 ENCOUNTER — Emergency Department (HOSPITAL_COMMUNITY): Payer: Medicare Other

## 2013-05-26 ENCOUNTER — Encounter (HOSPITAL_COMMUNITY): Payer: Self-pay | Admitting: Emergency Medicine

## 2013-05-26 DIAGNOSIS — G8929 Other chronic pain: Secondary | ICD-10-CM | POA: Insufficient documentation

## 2013-05-26 DIAGNOSIS — K5090 Crohn's disease, unspecified, without complications: Secondary | ICD-10-CM | POA: Insufficient documentation

## 2013-05-26 DIAGNOSIS — M549 Dorsalgia, unspecified: Secondary | ICD-10-CM | POA: Insufficient documentation

## 2013-05-26 DIAGNOSIS — I10 Essential (primary) hypertension: Secondary | ICD-10-CM | POA: Insufficient documentation

## 2013-05-26 DIAGNOSIS — Z79899 Other long term (current) drug therapy: Secondary | ICD-10-CM | POA: Insufficient documentation

## 2013-05-26 DIAGNOSIS — K219 Gastro-esophageal reflux disease without esophagitis: Secondary | ICD-10-CM | POA: Insufficient documentation

## 2013-05-26 DIAGNOSIS — Z87448 Personal history of other diseases of urinary system: Secondary | ICD-10-CM | POA: Insufficient documentation

## 2013-05-26 DIAGNOSIS — F17200 Nicotine dependence, unspecified, uncomplicated: Secondary | ICD-10-CM | POA: Insufficient documentation

## 2013-05-26 DIAGNOSIS — Z3202 Encounter for pregnancy test, result negative: Secondary | ICD-10-CM | POA: Insufficient documentation

## 2013-05-26 DIAGNOSIS — N390 Urinary tract infection, site not specified: Secondary | ICD-10-CM | POA: Insufficient documentation

## 2013-05-26 DIAGNOSIS — R109 Unspecified abdominal pain: Secondary | ICD-10-CM

## 2013-05-26 LAB — COMPREHENSIVE METABOLIC PANEL
ALT: <5 U/L (ref 0–35)
AST: 7 U/L (ref 0–37)
Albumin: 3.1 g/dL — ABNORMAL LOW (ref 3.5–5.2)
Alkaline Phosphatase: 33 U/L — ABNORMAL LOW (ref 39–117)
BUN: 14 mg/dL (ref 6–23)
CO2: 25 mEq/L (ref 19–32)
Calcium: 9 mg/dL (ref 8.4–10.5)
Chloride: 102 mEq/L (ref 96–112)
Creatinine, Ser: 0.77 mg/dL (ref 0.50–1.10)
GFR calc Af Amer: >90 mL/min (ref >90)
GFR calc non Af Amer: >90 mL/min (ref >90)
Glucose, Bld: 98 mg/dL (ref 70–99)
Potassium: 3.5 mEq/L — ABNORMAL LOW (ref 3.7–5.3)
Sodium: 138 mEq/L (ref 137–147)
Total Bilirubin: 0.1 mg/dL — ABNORMAL LOW (ref 0.3–1.2)
Total Protein: 7.3 g/dL (ref 6.0–8.3)

## 2013-05-26 LAB — URINE MICROSCOPIC-ADD ON

## 2013-05-26 LAB — CBC WITH DIFFERENTIAL/PLATELET
Basophils Absolute: 0 10*3/uL (ref 0.0–0.1)
Basophils Relative: 0 % (ref 0–1)
Eosinophils Absolute: 0.2 10*3/uL (ref 0.0–0.7)
Eosinophils Relative: 2 % (ref 0–5)
HCT: 36.6 % (ref 36.0–46.0)
Hemoglobin: 12.2 g/dL (ref 12.0–15.0)
Lymphocytes Relative: 15 % (ref 12–46)
Lymphs Abs: 2 10*3/uL (ref 0.7–4.0)
MCH: 28.2 pg (ref 26.0–34.0)
MCHC: 33.3 g/dL (ref 30.0–36.0)
MCV: 84.5 fL (ref 78.0–100.0)
Monocytes Absolute: 0.6 10*3/uL (ref 0.1–1.0)
Monocytes Relative: 5 % (ref 3–12)
Neutro Abs: 10.4 10*3/uL — ABNORMAL HIGH (ref 1.7–7.7)
Neutrophils Relative %: 79 % — ABNORMAL HIGH (ref 43–77)
Platelets: 291 10*3/uL (ref 150–400)
RBC: 4.33 MIL/uL (ref 3.87–5.11)
RDW: 15.8 % — ABNORMAL HIGH (ref 11.5–15.5)
WBC: 13.3 10*3/uL — ABNORMAL HIGH (ref 4.0–10.5)

## 2013-05-26 LAB — URINALYSIS, ROUTINE W REFLEX MICROSCOPIC
Bilirubin Urine: NEGATIVE
Glucose, UA: NEGATIVE mg/dL
Ketones, ur: NEGATIVE mg/dL
Nitrite: POSITIVE — AB
Protein, ur: 100 mg/dL — AB
Specific Gravity, Urine: >1.03 — ABNORMAL HIGH (ref 1.005–1.030)
Urobilinogen, UA: 0.2 mg/dL (ref 0.0–1.0)
pH: 6 (ref 5.0–8.0)

## 2013-05-26 LAB — LIPASE, BLOOD: Lipase: 25 U/L (ref 11–59)

## 2013-05-26 LAB — PREGNANCY, URINE: Preg Test, Ur: NEGATIVE

## 2013-05-26 MED ORDER — HYDROMORPHONE HCL PF 1 MG/ML IJ SOLN
1.0000 mg | Freq: Once | INTRAMUSCULAR | Status: AC
  Administered 2013-05-26: 1 mg via INTRAVENOUS
  Filled 2013-05-26: qty 1

## 2013-05-26 MED ORDER — SODIUM CHLORIDE 0.9 % IV SOLN
1000.0000 mL | INTRAVENOUS | Status: DC
  Administered 2013-05-26: 1000 mL via INTRAVENOUS

## 2013-05-26 MED ORDER — OXYCODONE-ACETAMINOPHEN 5-325 MG PO TABS: 1.0000 | ORAL_TABLET | ORAL | Status: DC | PRN

## 2013-05-26 MED ORDER — ONDANSETRON HCL 4 MG/2ML IJ SOLN
4.0000 mg | Freq: Once | INTRAMUSCULAR | Status: AC
  Administered 2013-05-26: 4 mg via INTRAVENOUS
  Filled 2013-05-26: qty 2

## 2013-05-26 MED ORDER — CEPHALEXIN 500 MG PO CAPS: 500.0000 mg | ORAL_CAPSULE | Freq: Four times a day (QID) | ORAL | Status: DC

## 2013-05-26 MED ORDER — DEXTROSE 5 % IV SOLN
1.0000 g | Freq: Once | INTRAVENOUS | Status: AC
  Administered 2013-05-26: 1 g via INTRAVENOUS
  Filled 2013-05-26: qty 10

## 2013-05-26 MED ORDER — PHENAZOPYRIDINE HCL 100 MG PO TABS
200.0000 mg | ORAL_TABLET | Freq: Once | ORAL | Status: AC
  Administered 2013-05-26: 200 mg via ORAL
  Filled 2013-05-26: qty 2

## 2013-05-26 MED ORDER — SODIUM CHLORIDE 0.9 % IV SOLN
1000.0000 mL | Freq: Once | INTRAVENOUS | Status: AC
  Administered 2013-05-26: 1000 mL via INTRAVENOUS

## 2013-05-26 MED ORDER — ONDANSETRON HCL 4 MG PO TABS: 4.0000 mg | ORAL_TABLET | Freq: Four times a day (QID) | ORAL | Status: DC | PRN

## 2013-05-26 MED ORDER — PHENAZOPYRIDINE HCL 200 MG PO TABS: 200.0000 mg | ORAL_TABLET | Freq: Three times a day (TID) | ORAL | Status: DC | PRN

## 2013-05-26 NOTE — ED Provider Notes (Signed)
CSN: [id number]     Arrival date & time 05/26/13  0550 History   First MD Initiated Contact with Patient 05/26/13 0602     Chief Complaint  Patient presents with  . Flank Pain     (Consider location/radiation/quality/duration/timing/severity/associated sxs/prior Treatment) Patient is a 38 y.o. female presenting with flank pain. The history is provided by the patient.  Flank Pain  She has been having right flank pain for the last 4 days. Pain radiates to the right lower abdomen. There is associated urinary urgency and urinary tenesmus. She describes some discomfort when she urinates but not actual pain. There is associated nausea and vomiting. She denies fever chills. Pain is severe and she rates it at 8/10. Nothing makes it better nothing makes it worse. She does have history of kidney stones.  Past Medical History  Diagnosis Date  . Back pain, chronic   . Crohn disease   . Hypertension   . Gastroesophageal reflux   . Renal disorder    Past Surgical History  Procedure Laterality Date  . Colectomy    . Tubal ligation    . Tonsillectomy     No family history on file. History  Substance Use Topics  . Smoking status: Current Every Day Smoker  . Smokeless tobacco: Not on file  . Alcohol Use: No   OB History   Grav Para Term Preterm Abortions TAB SAB Ect Mult Living                 Review of Systems  Genitourinary: Positive for flank pain.  All other systems reviewed and are negative.      Allergies  Darvocet; Ibuprofen; Remicade; Tramadol; Erythromycin; and Sulfa antibiotics  Home Medications   Current Outpatient Rx  Name  Route  Sig  Dispense  Refill  . ALPRAZolam (XANAX) 0.5 MG tablet   Oral   Take 0.5 mg by mouth at bedtime as needed for sleep.         . balsalazide (COLAZAL) 750 MG capsule   Oral   Take 2,250 mg by mouth 2 (two) times daily.         . cyclobenzaprine (FLEXERIL) 10 MG tablet   Oral   Take 10 mg by mouth 3 (three) times daily as  needed for muscle spasms.         Marland Kitchen. dextroamphetamine (DEXEDRINE SPANSULE) 15 MG 24 hr capsule   Oral   Take 30 mg by mouth daily.         . diphenoxylate-atropine (LOMOTIL) 2.5-0.025 MG per tablet   Oral   Take by mouth daily as needed for diarrhea or loose stools.          . fentaNYL (DURAGESIC - DOSED MCG/HR) 50 MCG/HR   Transdermal   Place 1 patch onto the skin every 3 (three) days.         . hydrochlorothiazide (HYDRODIURIL) 25 MG tablet   Oral   Take 25 mg by mouth daily.         Marland Kitchen. HYDROcodone-acetaminophen (NORCO/VICODIN) 5-325 MG per tablet   Oral   Take 1-2 tablets by mouth every 6 (six) hours as needed for moderate pain.   20 tablet   0   . lansoprazole (PREVACID) 30 MG capsule   Oral   Take 30 mg by mouth daily.         . ondansetron (ZOFRAN) 4 MG tablet   Oral   Take 1 tablet (4 mg total) by mouth every 6 (  six) hours.   12 tablet   0   . potassium chloride SA (K-DUR,KLOR-CON) 20 MEQ tablet   Oral   Take 20 mEq by mouth daily.         . promethazine (PHENERGAN) 25 MG tablet   Oral   Take 1 tablet (25 mg total) by mouth every 6 (six) hours as needed for nausea or vomiting.   12 tablet   1    BP 136/90  Pulse 103  Temp(Src) 98.4 F (36.9 C) (Oral)  Resp 16  Ht 5' (1.524 m)  Wt 199 lb (90.266 kg)  BMI 38.86 kg/m2  SpO2 99%  LMP 04/10/2013 Physical Exam  Nursing note and vitals reviewed.  38 year old female, resting comfortably and in no acute distress. Vital signs are significant for tachycardia with heart rate 103. Oxygen saturation is 99%, which is normal. Head is normocephalic and atraumatic. PERRLA, EOMI. Oropharynx is clear. Neck is nontender and supple without adenopathy or JVD. Back is nontender in the midline. There is moderate right CVA tenderness. Lungs are clear without rales, wheezes, or rhonchi. Chest is nontender. Heart has regular rate and rhythm without murmur. Abdomen is soft, flat, with mild to moderate right  lower quadrant tenderness. There is no rebound or guarding. Colostomy is present and draining normally. There are no masses or hepatosplenomegaly and peristalsis is hypoactive. Extremities have no cyanosis or edema, full range of motion is present. Skin is warm and dry without rash. Neurologic: Mental status is normal, cranial nerves are intact, there are no motor or sensory deficits.  ED Course  Procedures (including critical care time) Labs Review Results for orders placed during the hospital encounter of 05/26/13  URINALYSIS, ROUTINE W REFLEX MICROSCOPIC      Result Value Ref Range   Color, Urine YELLOW  YELLOW   APPearance HAZY (*) CLEAR   Specific Gravity, Urine >1.030 (*) 1.005 - 1.030   pH 6.0  5.0 - 8.0   Glucose, UA NEGATIVE  NEGATIVE mg/dL   Hgb urine dipstick MODERATE (*) NEGATIVE   Bilirubin Urine NEGATIVE  NEGATIVE   Ketones, ur NEGATIVE  NEGATIVE mg/dL   Protein, ur 361 (*) NEGATIVE mg/dL   Urobilinogen, UA 0.2  0.0 - 1.0 mg/dL   Nitrite POSITIVE (*) NEGATIVE   Leukocytes, UA SMALL (*) NEGATIVE  PREGNANCY, URINE      Result Value Ref Range   Preg Test, Ur NEGATIVE  NEGATIVE  CBC WITH DIFFERENTIAL      Result Value Ref Range   WBC 13.3 (*) 4.0 - 10.5 K/uL   RBC 4.33  3.87 - 5.11 MIL/uL   Hemoglobin 12.2  12.0 - 15.0 g/dL   HCT 22.4  49.7 - 53.0 %   MCV 84.5  78.0 - 100.0 fL   MCH 28.2  26.0 - 34.0 pg   MCHC 33.3  30.0 - 36.0 g/dL   RDW 05.1 (*) 10.2 - 11.1 %   Platelets 291  150 - 400 K/uL   Neutrophils Relative % 79 (*) 43 - 77 %   Neutro Abs 10.4 (*) 1.7 - 7.7 K/uL   Lymphocytes Relative 15  12 - 46 %   Lymphs Abs 2.0  0.7 - 4.0 K/uL   Monocytes Relative 5  3 - 12 %   Monocytes Absolute 0.6  0.1 - 1.0 K/uL   Eosinophils Relative 2  0 - 5 %   Eosinophils Absolute 0.2  0.0 - 0.7 K/uL   Basophils Relative 0  0 - 1 %   Basophils Absolute 0.0  0.0 - 0.1 K/uL  COMPREHENSIVE METABOLIC PANEL      Result Value Ref Range   Sodium 138  137 - 147 mEq/L   Potassium  3.5 (*) 3.7 - 5.3 mEq/L   Chloride 102  96 - 112 mEq/L   CO2 25  19 - 32 mEq/L   Glucose, Bld 98  70 - 99 mg/dL   BUN 14  6 - 23 mg/dL   Creatinine, Ser 1.61  0.50 - 1.10 mg/dL   Calcium 9.0  8.4 - 09.6 mg/dL   Total Protein 7.3  6.0 - 8.3 g/dL   Albumin 3.1 (*) 3.5 - 5.2 g/dL   AST 7  0 - 37 U/L   ALT <5  0 - 35 U/L   Alkaline Phosphatase 33 (*) 39 - 117 U/L   Total Bilirubin 0.1 (*) 0.3 - 1.2 mg/dL   GFR calc non Af Amer >90  >90 mL/min   GFR calc Af Amer >90  >90 mL/min  LIPASE, BLOOD      Result Value Ref Range   Lipase 25  11 - 59 U/L  URINE MICROSCOPIC-ADD ON      Result Value Ref Range   Squamous Epithelial / LPF FEW (*) RARE   WBC, UA 7-10  <3 WBC/hpf   RBC / HPF TOO NUMEROUS TO COUNT  <3 RBC/hpf   Bacteria, UA FEW (*) RARE   Imaging Review Ct Abdomen Pelvis Wo Contrast  05/26/2013   CLINICAL DATA:  Intermittent right flank pain and dysuria since Thursday. White discharge with urination. Chronic back pain. Crohn's disease.  EXAM: CT ABDOMEN AND PELVIS WITHOUT CONTRAST  TECHNIQUE: Multidetector CT imaging of the abdomen and pelvis was performed following the standard protocol without intravenous contrast.  COMPARISON:  CT ABD - PELV W/ CM dated 05/03/2013  FINDINGS: Lower Chest: Subsegmental atelectasis at the lung bases. Normal heart size without pericardial or pleural effusion.  Abdomen/Pelvis: Normal noncontrast appearance of the liver, spleen, stomach, pancreas, gallbladder, biliary tract, adrenal glands.  No renal calculi or hydronephrosis. No hydroureter or ureteric calculi. Subtle edema about the right renal pelvis suspected on image 40/series 2. No retroperitoneal or retrocrural adenopathy.  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 Example image 45. Normal terminal ileum. No bowel obstruction. No ascites.  No pelvic adenopathy. Edema surrounding the urinary bladder on image 79/series  2. Rectal stump. Facial thickening is also identified about the posterior pelvis, including on image 77/series 2. Otherwise normal appearance of the uterus. No adnexal mass. Trace cul-de-sac fluid on image 77 is similar to on the prior exam and nonspecific.  Bones/Musculoskeletal:  No acute osseous abnormality.  IMPRESSION: 1. No urinary tract calculi or hydronephrosis. Otherwise, low sensitivity exam secondary to stone study technique. 2. Pelvic edema, favored to be centered about the urinary bladder. Favor cystitis. Subtle edema about the right renal pelvis, for which ascending infection cannot be excluded. 3. Left hemicolectomy with right-sided colostomy. Similar stomal redundancy. 4. Mild edema throughout the remainder of the pelvis, nonspecific. Can't exclude a component of pelvic inflammatory disease.   Electronically Signed   By: Jeronimo Greaves M.D.   On: 05/26/2013 07:43   Images viewed by me.  MDM   Final diagnoses:  None    Right flank pain which could be from the ureteral colic or urinary tract infection. Urinalysis will be sent and she will be sent  for CT of abdomen and pelvis. IV fluids will be given and she will be given ondansetron for nausea and hydromorphone for pain.    Dione Booze, MD 05/26/13 [phone]

## 2013-05-26 NOTE — ED Notes (Signed)
Has had intermittent R flank pain and dysuria since Thursday.  Describes as having a pressure sensation and white discharge and decreased urine output when she urinates.

## 2013-05-26 NOTE — Discharge Instructions (Signed)
Urinary Tract Infection Urinary tract infections (UTIs) can develop anywhere along your urinary tract. Your urinary tract is your body's drainage system for removing wastes and extra water. Your urinary tract includes two kidneys, two ureters, a bladder, and a urethra. Your kidneys are a pair of bean-shaped organs. Each kidney is about the size of your fist. They are located below your ribs, one on each side of your spine. CAUSES Infections are caused by microbes, which are microscopic organisms, including fungi, viruses, and bacteria. These organisms are so small that they can only be seen through a microscope. Bacteria are the microbes that most commonly cause UTIs. SYMPTOMS  Symptoms of UTIs may vary by age and gender of the patient and by the location of the infection. Symptoms in young women typically include a frequent and intense urge to urinate and a painful, burning feeling in the bladder or urethra during urination. Older women and men are more likely to be tired, shaky, and weak and have muscle aches and abdominal pain. A fever may mean the infection is in your kidneys. Other symptoms of a kidney infection include pain in your back or sides below the ribs, nausea, and vomiting. DIAGNOSIS To diagnose a UTI, your caregiver will ask you about your symptoms. Your caregiver also will ask to provide a urine sample. The urine sample will be tested for bacteria and white blood cells. White blood cells are made by your body to help fight infection. TREATMENT  Typically, UTIs can be treated with medication. Because most UTIs are caused by a bacterial infection, they usually can be treated with the use of antibiotics. The choice of antibiotic and length of treatment depend on your symptoms and the type of bacteria causing your infection. HOME CARE INSTRUCTIONS  If you were prescribed antibiotics, take them exactly as your caregiver instructs you. Finish the medication even if you feel better after you  have only taken some of the medication.  Drink enough water and fluids to keep your urine clear or pale yellow.  Avoid caffeine, tea, and carbonated beverages. They tend to irritate your bladder.  Empty your bladder often. Avoid holding urine for long periods of time.  Empty your bladder before and after sexual intercourse.  After a bowel movement, women should cleanse from front to back. Use each tissue only once. SEEK MEDICAL CARE IF:   You have back pain.  You develop a fever.  Your symptoms do not begin to resolve within 3 days. SEEK IMMEDIATE MEDICAL CARE IF:   You have severe back pain or lower abdominal pain.  You develop chills.  You have nausea or vomiting.  You have continued burning or discomfort with urination. MAKE SURE YOU:   Understand these instructions.  Will watch your condition.  Will get help right away if you are not doing well or get worse. Document Released: 12/22/2004 Document Revised: 09/13/2011 Document Reviewed: 04/22/2011 [facility] Patient Information 2014 [place].  Cephalexin tablets or capsules What is this medicine? CEPHALEXIN (sef a LEX in) is a cephalosporin antibiotic. It is used to treat certain kinds of bacterial infections It will not work for colds, flu, or other viral infections. This medicine may be used for other purposes; ask your health care provider or pharmacist if you have questions. COMMON BRAND NAME(S): Biocef, Keflex, Keftab What should I tell my health care provider before I take this medicine? They need to know if you have any of these conditions: -kidney disease -stomach or intestine problems, especially colitis -  an unusual or allergic reaction to cephalexin, other cephalosporins, penicillins, other antibiotics, medicines, foods, dyes or preservatives -pregnant or trying to get pregnant -breast-feeding How should I use this medicine? Take this medicine by mouth with a full glass of water. Follow the  directions on the prescription label. This medicine can be taken with or without food. Take your medicine at regular intervals. Do not take your medicine more often than directed. Take all of your medicine as directed even if you think you are better. Do not skip doses or stop your medicine early. Talk to your pediatrician regarding the use of this medicine in children. While this drug may be prescribed for selected conditions, precautions do apply. Overdosage: If you think you have taken too much of this medicine contact a poison control center or emergency room at once. NOTE: This medicine is only for you. Do not share this medicine with others. What if I miss a dose? If you miss a dose, take it as soon as you can. If it is almost time for your next dose, take only that dose. Do not take double or extra doses. There should be at least 4 to 6 hours between doses. What may interact with this medicine? -probenecid -some other antibiotics This list may not describe all possible interactions. Give your health care provider a list of all the medicines, herbs, non-prescription drugs, or dietary supplements you use. Also tell them if you smoke, drink alcohol, or use illegal drugs. Some items may interact with your medicine. What should I watch for while using this medicine? Tell your doctor or health care professional if your symptoms do not begin to improve in a few days. Do not treat diarrhea with over the counter products. Contact your doctor if you have diarrhea that lasts more than 2 days or if it is severe and watery. If you have diabetes, you may get a false-positive result for sugar in your urine. Check with your doctor or health care professional. What side effects may I notice from receiving this medicine? Side effects that you should report to your doctor or health care professional as soon as possible: -allergic reactions like skin rash, itching or hives, swelling of the face, lips, or  tongue -breathing problems -pain or trouble passing urine -redness, blistering, peeling or loosening of the skin, including inside the mouth -severe or watery diarrhea -unusually weak or tired -yellowing of the eyes, skin Side effects that usually do not require medical attention (report to your doctor or health care professional if they continue or are bothersome): -gas or heartburn -genital or anal irritation -headache -joint or muscle pain -nausea, vomiting This list may not describe all possible side effects. Call your doctor for medical advice about side effects. You may report side effects to FDA at 1-800-FDA-1088. Where should I keep my medicine? Keep out of the reach of children. Store at room temperature between 59 and 86 degrees F (15 and 30 degrees C). Throw away any unused medicine after the expiration date. NOTE: This sheet is a summary. It may not cover all possible information. If you have questions about this medicine, talk to your doctor, pharmacist, or health care provider.  2014, [employer]. (2007-06-18 17:09:13)  Acetaminophen; Oxycodone tablets What is this medicine? ACETAMINOPHEN; OXYCODONE (a set a MEE noe fen; ox i KOE done) is a pain reliever. It is used to treat mild to moderate pain. This medicine may be used for other purposes; ask your health care provider or pharmacist  if you have questions. COMMON BRAND NAME(S): Endocet, Magnacet, Narvox, Percocet, Perloxx, Primalev, Primlev, Roxicet, Xolox What should I tell my health care provider before I take this medicine? They need to know if you have any of these conditions: -brain tumor -Crohn's disease, inflammatory bowel disease, or ulcerative colitis -drug abuse or addiction -head injury -heart or circulation problems -if you often drink alcohol -kidney disease or problems going to the bathroom -liver disease -lung disease, asthma, or breathing problems -an unusual or allergic reaction to  acetaminophen, oxycodone, other opioid analgesics, other medicines, foods, dyes, or preservatives -pregnant or trying to get pregnant -breast-feeding How should I use this medicine? Take this medicine by mouth with a full glass of water. Follow the directions on the prescription label. Take your medicine at regular intervals. Do not take your medicine more often than directed. Talk to your pediatrician regarding the use of this medicine in children. Special care may be needed. Patients over 41 years old may have a stronger reaction and need a smaller dose. Overdosage: If you think you have taken too much of this medicine contact a poison control center or emergency room at once. NOTE: This medicine is only for you. Do not share this medicine with others. What if I miss a dose? If you miss a dose, take it as soon as you can. If it is almost time for your next dose, take only that dose. Do not take double or extra doses. What may interact with this medicine? -alcohol -antihistamines -barbiturates like amobarbital, butalbital, butabarbital, methohexital, pentobarbital, phenobarbital, thiopental, and secobarbital -benztropine -drugs for bladder problems like solifenacin, trospium, oxybutynin, tolterodine, hyoscyamine, and methscopolamine -drugs for breathing problems like ipratropium and tiotropium -drugs for certain stomach or intestine problems like propantheline, homatropine methylbromide, glycopyrrolate, atropine, belladonna, and dicyclomine -general anesthetics like etomidate, ketamine, nitrous oxide, propofol, desflurane, enflurane, halothane, isoflurane, and sevoflurane -medicines for depression, anxiety, or psychotic disturbances -medicines for sleep -muscle relaxants -naltrexone -narcotic medicines (opiates) for pain -phenothiazines like perphenazine, thioridazine, chlorpromazine, mesoridazine, fluphenazine, prochlorperazine, promazine, and  trifluoperazine -scopolamine -tramadol -trihexyphenidyl This list may not describe all possible interactions. Give your health care provider a list of all the medicines, herbs, non-prescription drugs, or dietary supplements you use. Also tell them if you smoke, drink alcohol, or use illegal drugs. Some items may interact with your medicine. What should I watch for while using this medicine? Tell your doctor or health care professional if your pain does not go away, if it gets worse, or if you have new or a different type of pain. You may develop tolerance to the medicine. Tolerance means that you will need a higher dose of the medication for pain relief. Tolerance is normal and is expected if you take this medicine for a long time. Do not suddenly stop taking your medicine because you may develop a severe reaction. Your body becomes used to the medicine. This does NOT mean you are addicted. Addiction is a behavior related to getting and using a drug for a non-medical reason. If you have pain, you have a medical reason to take pain medicine. Your doctor will tell you how much medicine to take. If your doctor wants you to stop the medicine, the dose will be slowly lowered over time to avoid any side effects. You may get drowsy or dizzy. Do not drive, use machinery, or do anything that needs mental alertness until you know how this medicine affects you. Do not stand or sit up quickly, especially if you are  an older patient. This reduces the risk of dizzy or fainting spells. Alcohol may interfere with the effect of this medicine. Avoid alcoholic drinks. There are different types of narcotic medicines (opiates) for pain. If you take more than one type at the same time, you may have more side effects. Give your health care provider a list of all medicines you use. Your doctor will tell you how much medicine to take. Do not take more medicine than directed. Call emergency for help if you have problems  breathing. The medicine will cause constipation. Try to have a bowel movement at least every 2 to 3 days. If you do not have a bowel movement for 3 days, call your doctor or health care professional. Do not take Tylenol (acetaminophen) or medicines that have acetaminophen with this medicine. Too much acetaminophen can be very dangerous. Many nonprescription medicines contain acetaminophen. Always read the labels carefully to avoid taking more acetaminophen. What side effects may I notice from receiving this medicine? Side effects that you should report to your doctor or health care professional as soon as possible: -allergic reactions like skin rash, itching or hives, swelling of the face, lips, or tongue -breathing difficulties, wheezing -confusion -light headedness or fainting spells -severe stomach pain -unusually weak or tired -yellowing of the skin or the whites of the eyes  Side effects that usually do not require medical attention (report to your doctor or health care professional if they continue or are bothersome): -dizziness -drowsiness -nausea -vomiting This list may not describe all possible side effects. Call your doctor for medical advice about side effects. You may report side effects to FDA at 1-800-FDA-1088. Where should I keep my medicine? Keep out of the reach of children. This medicine can be abused. Keep your medicine in a safe place to protect it from theft. Do not share this medicine with anyone. Selling or giving away this medicine is dangerous and against the law. Store at room temperature between 20 and 25 degrees C (68 and 77 degrees F). Keep container tightly closed. Protect from light. This medicine may cause accidental overdose and death if it is taken by other adults, children, or pets. Flush any unused medicine down the toilet to reduce the chance of harm. Do not use the medicine after the expiration date. NOTE: This sheet is a summary. It may not cover all  possible information. If you have questions about this medicine, talk to your doctor, pharmacist, or health care provider.  2014, [employer] Standard. (2012-11-05 13:17:35)  Phenazopyridine tablets What is this medicine? PHENAZOPYRIDINE (fen az oh PEER i deen) is a pain reliever. It is used to stop the pain, burning, or discomfort caused by infection or irritation of the urinary tract. This medicine is not an antibiotic. It will not cure a urinary tract infection. This medicine may be used for other purposes; ask your health care provider or pharmacist if you have questions. COMMON BRAND NAME(S): AZO, Azo-100, Azo-Gesic, Azo-Septic, Azo-Standard, Phenazo, Prodium, Pyridium, Urinary Analgesic , Uristat What should I tell my health care provider before I take this medicine? They need to know if you have any of these conditions: -glucose-6-phosphate dehydrogenase (G6PD) deficiency -kidney disease -an unusual or allergic reaction to phenazopyridine, other medicines, foods, dyes, or preservatives -pregnant or trying to get pregnant -breast-feeding How should I use this medicine? Take this medicine by mouth with a glass of water. Follow the directions on the prescription label. Take after meals. Take your doses at regular intervals. Do not  take your medicine more often than directed. Do not skip doses or stop your medicine early even if you feel better. Do not stop taking except on your doctor's advice. Talk to your pediatrician regarding the use of this medicine in children. Special care may be needed. Overdosage: If you think you have taken too much of this medicine contact a poison control center or emergency room at once. NOTE: This medicine is only for you. Do not share this medicine with others. What if I miss a dose? If you miss a dose, take it as soon as you can. If it is almost time for your next dose, take only that dose. Do not take double or extra doses. What may interact with this  medicine? Interactions are not expected. This list may not describe all possible interactions. Give your health care provider a list of all the medicines, herbs, non-prescription drugs, or dietary supplements you use. Also tell them if you smoke, drink alcohol, or use illegal drugs. Some items may interact with your medicine. What should I watch for while using this medicine? Tell your doctor or health care professional if your symptoms do not improve or if they get worse. This medicine colors body fluids red. This effect is harmless and will go away after you are done taking the medicine. It will change urine to an dark orange or red color. The red color may stain clothing. Soft contact lenses may become permanently stained. It is best not to wear soft contact lenses while taking this medicine. If you are diabetic you may get a false positive result for sugar in your urine. Talk to your health care provider. What side effects may I notice from receiving this medicine? Side effects that you should report to your doctor or health care professional as soon as possible: -allergic reactions like skin rash, itching or hives, swelling of the face, lips, or tongue -blue or purple color of the skin -difficulty breathing -fever -less urine -unusual bleeding, bruising -unusual tired, weak -vomiting -yellowing of the eyes or skin Side effects that usually do not require medical attention (report to your doctor or health care professional if they continue or are bothersome): -dark urine -headache -stomach upset This list may not describe all possible side effects. Call your doctor for medical advice about side effects. You may report side effects to FDA at 1-800-FDA-1088. Where should I keep my medicine? Keep out of the reach of children. Store at room temperature between 15 and 30 degrees C (59 and 86 degrees F). Protect from light and moisture. Throw away any unused medicine after the expiration  date. NOTE: This sheet is a summary. It may not cover all possible information. If you have questions about this medicine, talk to your doctor, pharmacist, or health care provider.  2014, [employer] Standard. (2007-10-11 11:04:07)  Ondansetron tablets What is this medicine? ONDANSETRON (on DAN se tron) is used to treat nausea and vomiting caused by chemotherapy. It is also used to prevent or treat nausea and vomiting after surgery. This medicine may be used for other purposes; ask your health care provider or pharmacist if you have questions. COMMON BRAND NAME(S): Zofran What should I tell my health care provider before I take this medicine? They need to know if you have any of these conditions: -heart disease -history of irregular heartbeat -liver disease -low levels of magnesium or potassium in the blood -an unusual or allergic reaction to ondansetron, granisetron, other medicines, foods, dyes, or preservatives -pregnant or  trying to get pregnant -breast-feeding How should I use this medicine? Take this medicine by mouth with a glass of water. Follow the directions on your prescription label. Take your doses at regular intervals. Do not take your medicine more often than directed. Talk to your pediatrician regarding the use of this medicine in children. Special care may be needed. Overdosage: If you think you have taken too much of this medicine contact a poison control center or emergency room at once. NOTE: This medicine is only for you. Do not share this medicine with others. What if I miss a dose? If you miss a dose, take it as soon as you can. If it is almost time for your next dose, take only that dose. Do not take double or extra doses. What may interact with this medicine? Do not take this medicine with any of the following medications: -apomorphine -certain medicines for fungal infections like fluconazole, itraconazole, ketoconazole, posaconazole,  voriconazole -cisapride -dofetilide -dronedarone -pimozide -thioridazine -ziprasidone  This medicine may also interact with the following medications: -carbamazepine -certain medicines for depression, anxiety, or psychotic disturbances -fentanyl -linezolid -MAOIs like Carbex, Eldepryl, Marplan, Nardil, and Parnate -methylene blue (injected into a vein) -other medicines that prolong the QT interval (cause an abnormal heart rhythm) -phenytoin -rifampicin -tramadol This list may not describe all possible interactions. Give your health care provider a list of all the medicines, herbs, non-prescription drugs, or dietary supplements you use. Also tell them if you smoke, drink alcohol, or use illegal drugs. Some items may interact with your medicine. What should I watch for while using this medicine? Check with your doctor or health care professional right away if you have any sign of an allergic reaction. What side effects may I notice from receiving this medicine? Side effects that you should report to your doctor or health care professional as soon as possible: -allergic reactions like skin rash, itching or hives, swelling of the face, lips or tongue -breathing problems -confusion -dizziness -fast or irregular heartbeat -feeling faint or lightheaded, falls -fever and chills -loss of balance or coordination -seizures -sweating -swelling of the hands or feet -tightness in the chest -tremors -unusually weak or tired Side effects that usually do not require medical attention (report to your doctor or health care professional if they continue or are bothersome): -constipation or diarrhea -headache This list may not describe all possible side effects. Call your doctor for medical advice about side effects. You may report side effects to FDA at 1-800-FDA-1088. Where should I keep my medicine? Keep out of the reach of children. Store between 2 and 30 degrees C (36 and 86 degrees F).  Throw away any unused medicine after the expiration date. NOTE: This sheet is a summary. It may not cover all possible information. If you have questions about this medicine, talk to your doctor, pharmacist, or health care provider.  2014, [employer] Standard. (2012-12-19 16:27:45)

## 2013-05-26 NOTE — ED Notes (Signed)
Pt alert & oriented x4, stable gait. Patient given discharge instructions, paperwork & prescription(s). Patient  instructed to stop at the registration desk to finish any additional paperwork. Patient verbalized understanding. Pt left department w/ no further questions. 

## 2013-06-20 ENCOUNTER — Encounter (HOSPITAL_COMMUNITY): Payer: Self-pay | Admitting: Emergency Medicine

## 2013-06-20 ENCOUNTER — Emergency Department (HOSPITAL_COMMUNITY)
Admission: EM | Admit: 2013-06-20 | Discharge: 2013-06-21 | Disposition: A | Discharge status: Home / Self Care | Payer: Medicare Other | Attending: Emergency Medicine | Admitting: Emergency Medicine

## 2013-06-20 DIAGNOSIS — R5383 Other fatigue: Secondary | ICD-10-CM | POA: Insufficient documentation

## 2013-06-20 DIAGNOSIS — Z87448 Personal history of other diseases of urinary system: Secondary | ICD-10-CM | POA: Insufficient documentation

## 2013-06-20 DIAGNOSIS — G8929 Other chronic pain: Secondary | ICD-10-CM | POA: Insufficient documentation

## 2013-06-20 DIAGNOSIS — Z79899 Other long term (current) drug therapy: Secondary | ICD-10-CM | POA: Insufficient documentation

## 2013-06-20 DIAGNOSIS — R5381 Other malaise: Secondary | ICD-10-CM | POA: Insufficient documentation

## 2013-06-20 DIAGNOSIS — K5090 Crohn's disease, unspecified, without complications: Secondary | ICD-10-CM | POA: Insufficient documentation

## 2013-06-20 DIAGNOSIS — I10 Essential (primary) hypertension: Secondary | ICD-10-CM | POA: Insufficient documentation

## 2013-06-20 DIAGNOSIS — R197 Diarrhea, unspecified: Secondary | ICD-10-CM | POA: Insufficient documentation

## 2013-06-20 DIAGNOSIS — K219 Gastro-esophageal reflux disease without esophagitis: Secondary | ICD-10-CM | POA: Insufficient documentation

## 2013-06-20 DIAGNOSIS — F17200 Nicotine dependence, unspecified, uncomplicated: Secondary | ICD-10-CM | POA: Insufficient documentation

## 2013-06-20 DIAGNOSIS — K529 Noninfective gastroenteritis and colitis, unspecified: Secondary | ICD-10-CM

## 2013-06-20 DIAGNOSIS — K5289 Other specified noninfective gastroenteritis and colitis: Secondary | ICD-10-CM | POA: Insufficient documentation

## 2013-06-20 LAB — URINE MICROSCOPIC-ADD ON

## 2013-06-20 LAB — COMPREHENSIVE METABOLIC PANEL
ALT: 7 U/L (ref 0–35)
AST: 11 U/L (ref 0–37)
Albumin: 3.9 g/dL (ref 3.5–5.2)
Alkaline Phosphatase: 35 U/L — ABNORMAL LOW (ref 39–117)
BUN: 17 mg/dL (ref 6–23)
CO2: 23 mEq/L (ref 19–32)
Calcium: 9.7 mg/dL (ref 8.4–10.5)
Chloride: 102 mEq/L (ref 96–112)
Creatinine, Ser: 0.79 mg/dL (ref 0.50–1.10)
GFR calc Af Amer: >90 mL/min (ref >90)
GFR calc non Af Amer: >90 mL/min (ref >90)
Glucose, Bld: 109 mg/dL — ABNORMAL HIGH (ref 70–99)
Potassium: 3.4 mEq/L — ABNORMAL LOW (ref 3.7–5.3)
Sodium: 139 mEq/L (ref 137–147)
Total Bilirubin: 0.5 mg/dL (ref 0.3–1.2)
Total Protein: 8.3 g/dL (ref 6.0–8.3)

## 2013-06-20 LAB — URINALYSIS, ROUTINE W REFLEX MICROSCOPIC
Bilirubin Urine: NEGATIVE
Glucose, UA: NEGATIVE mg/dL
Ketones, ur: NEGATIVE mg/dL
Nitrite: NEGATIVE
Specific Gravity, Urine: >1.03 — ABNORMAL HIGH (ref 1.005–1.030)
Urobilinogen, UA: 0.2 mg/dL (ref 0.0–1.0)
pH: 6 (ref 5.0–8.0)

## 2013-06-20 LAB — CBC WITH DIFFERENTIAL/PLATELET
Basophils Absolute: 0 10*3/uL (ref 0.0–0.1)
Basophils Relative: 0 % (ref 0–1)
Eosinophils Absolute: 0.1 10*3/uL (ref 0.0–0.7)
Eosinophils Relative: 1 % (ref 0–5)
HCT: 42 % (ref 36.0–46.0)
Hemoglobin: 13.8 g/dL (ref 12.0–15.0)
Lymphocytes Relative: 13 % (ref 12–46)
Lymphs Abs: 1.3 10*3/uL (ref 0.7–4.0)
MCH: 27.7 pg (ref 26.0–34.0)
MCHC: 32.9 g/dL (ref 30.0–36.0)
MCV: 84.3 fL (ref 78.0–100.0)
Monocytes Absolute: 0.5 10*3/uL (ref 0.1–1.0)
Monocytes Relative: 5 % (ref 3–12)
Neutro Abs: 8 10*3/uL — ABNORMAL HIGH (ref 1.7–7.7)
Neutrophils Relative %: 81 % — ABNORMAL HIGH (ref 43–77)
Platelets: 289 10*3/uL (ref 150–400)
RBC: 4.98 MIL/uL (ref 3.87–5.11)
RDW: 16 % — ABNORMAL HIGH (ref 11.5–15.5)
WBC: 9.9 10*3/uL (ref 4.0–10.5)

## 2013-06-20 LAB — LIPASE, BLOOD: Lipase: 25 U/L (ref 11–59)

## 2013-06-20 MED ORDER — ONDANSETRON HCL 4 MG/2ML IJ SOLN
4.0000 mg | Freq: Once | INTRAMUSCULAR | Status: AC
  Administered 2013-06-20: 4 mg via INTRAVENOUS
  Filled 2013-06-20: qty 2

## 2013-06-20 MED ORDER — PROMETHAZINE HCL 25 MG/ML IJ SOLN
25.0000 mg | Freq: Once | INTRAMUSCULAR | Status: AC
Start: 2013-06-20 — End: 2013-06-20
  Administered 2013-06-20: 25 mg via INTRAVENOUS
  Filled 2013-06-20: qty 1

## 2013-06-20 NOTE — ED Notes (Signed)
Woke with vomiting 2am then diarrhea stated shortly thereafter. Toddler son started with same @ same time

## 2013-06-20 NOTE — ED Notes (Signed)
Pt. Given ginger ale and instructed to sip on it slowly.

## 2013-06-20 NOTE — ED Notes (Addendum)
Hung 1L NS bolus per protocol

## 2013-06-21 MED ORDER — DIPHENOXYLATE-ATROPINE 2.5-0.025 MG PO TABS: 1.0000 | ORAL_TABLET | Freq: Four times a day (QID) | ORAL | Status: DC | PRN

## 2013-06-21 MED ORDER — PROMETHAZINE HCL 25 MG PO TABS: 25.0000 mg | ORAL_TABLET | Freq: Four times a day (QID) | ORAL | Status: DC | PRN

## 2013-06-21 MED ORDER — DIPHENOXYLATE-ATROPINE 2.5-0.025 MG PO TABS
2.0000 | ORAL_TABLET | Freq: Once | ORAL | Status: AC
  Administered 2013-06-21: 2 via ORAL
  Filled 2013-06-21: qty 2

## 2013-06-21 MED ORDER — DICYCLOMINE HCL 10 MG PO CAPS
20.0000 mg | ORAL_CAPSULE | Freq: Once | ORAL | Status: AC
  Administered 2013-06-21: 20 mg via ORAL
  Filled 2013-06-21: qty 2

## 2013-06-21 MED ORDER — LOPERAMIDE HCL 2 MG PO CAPS: 4.0000 mg | ORAL_CAPSULE | Freq: Once | ORAL | Status: DC

## 2013-06-21 NOTE — ED Provider Notes (Signed)
CSN: [id number]     Arrival date & time 06/20/13  2039 History   First MD Initiated Contact with Patient 06/20/13 2145     Chief Complaint  Patient presents with  . Emesis     (Consider location/radiation/quality/duration/timing/severity/associated sxs/prior Treatment) HPI Comments: Samantha Page is a 38 y.o. Female presenting with vomiting and diarrhea which started roughly 20 hours ago along with low grade subjective fevers and weakness.  She reports frequent non bloody, non bilious vomiting throughout the day, and reports having to empty her colostomy bag about 8 times since her illness began, she denies blood but has seen mucous in her stool which is her norm.  She has generalized abdominal cramping which is waxing and waning. She has taken no medicines prior to arrival. Her toddler son developed similar symptoms about the same time this morning and is also here to be evaluated.  She denies dysuria.  She is currently menstruating.     The history is provided by the patient and a significant other.    Past Medical History  Diagnosis Date  . Back pain, chronic   . Crohn disease   . Hypertension   . Gastroesophageal reflux   . Renal disorder    Past Surgical History  Procedure Laterality Date  . Colectomy    . Tubal ligation    . Tonsillectomy     No family history on file. History  Substance Use Topics  . Smoking status: Current Every Day Smoker  . Smokeless tobacco: Not on file  . Alcohol Use: No   OB History   Grav Para Term Preterm Abortions TAB SAB Ect Mult Living                 Review of Systems  Constitutional: Positive for fatigue. Negative for fever.  HENT: Negative for congestion and sore throat.   Eyes: Negative.   Respiratory: Negative for chest tightness and shortness of breath.   Cardiovascular: Negative for chest pain.  Gastrointestinal: Positive for nausea, vomiting and diarrhea. Negative for abdominal pain and abdominal distention.  Genitourinary:  Negative.   Musculoskeletal: Negative for arthralgias, joint swelling and neck pain.  Skin: Negative.  Negative for rash and wound.  Neurological: Negative for dizziness, weakness, light-headedness, numbness and headaches.  Psychiatric/Behavioral: Negative.       Allergies  Darvocet; Ibuprofen; Remicade; Tramadol; Erythromycin; and Sulfa antibiotics  Home Medications   Current Outpatient Rx  Name  Route  Sig  Dispense  Refill  . ALPRAZolam (XANAX) 0.5 MG tablet   Oral   Take 0.5 mg by mouth at bedtime as needed for sleep.         . balsalazide (COLAZAL) 750 MG capsule   Oral   Take 2,250 mg by mouth 2 (two) times daily.         . cyclobenzaprine (FLEXERIL) 10 MG tablet   Oral   Take 10 mg by mouth 3 (three) times daily as needed for muscle spasms.         Marland Kitchen dextroamphetamine (DEXEDRINE SPANSULE) 15 MG 24 hr capsule   Oral   Take 30 mg by mouth daily.         . diphenoxylate-atropine (LOMOTIL) 2.5-0.025 MG per tablet   Oral   Take 1 tablet by mouth daily as needed for diarrhea or loose stools.          . fentaNYL (DURAGESIC - DOSED MCG/HR) 25 MCG/HR patch   Transdermal   Place 25 mcg onto the  skin every 3 (three) days.         Marland Kitchen gabapentin (NEURONTIN) 300 MG capsule   Oral   Take 300 mg by mouth at bedtime. Prescribed one capsule three times daily         . lansoprazole (PREVACID) 30 MG capsule   Oral   Take 30 mg by mouth daily.         Marland Kitchen losartan (COZAAR) 25 MG tablet   Oral   Take 25 mg by mouth daily.         . ondansetron (ZOFRAN) 4 MG tablet   Oral   Take 1 tablet (4 mg total) by mouth every 6 (six) hours as needed for nausea or vomiting.   12 tablet   0   . promethazine (PHENERGAN) 25 MG tablet   Oral   Take 1 tablet (25 mg total) by mouth every 6 (six) hours as needed for nausea or vomiting.   12 tablet   1   . diphenoxylate-atropine (LOMOTIL) 2.5-0.025 MG per tablet   Oral   Take 1 tablet by mouth 4 (four) times daily as  needed for diarrhea or loose stools.   15 tablet   0   . promethazine (PHENERGAN) 25 MG tablet   Oral   Take 1 tablet (25 mg total) by mouth every 6 (six) hours as needed for nausea or vomiting.   15 tablet   0    BP 116/84  Pulse 88  Temp(Src) 98.6 F (37 C) (Oral)  Resp 16  Ht 5' (1.524 m)  Wt 180 lb (81.647 kg)  BMI 35.15 kg/m2  SpO2 100%  LMP 06/18/2013 Physical Exam  Nursing note and vitals reviewed. Constitutional: She appears well-developed and well-nourished.  HENT:  Head: Normocephalic and atraumatic.  Mouth/Throat: Mucous membranes are dry.  Eyes: Conjunctivae are normal.  Neck: Normal range of motion.  Cardiovascular: Normal rate, regular rhythm, normal heart sounds and intact distal pulses.   Pulmonary/Chest: Effort normal and breath sounds normal. She has no decreased breath sounds. She has no wheezes. She has no rhonchi.  Abdominal: Soft. Bowel sounds are normal. There is generalized tenderness. There is no rigidity, no rebound, no guarding, no tenderness at McBurney's point and negative Murphy's sign.  Colostomy in right lower quadrant containing brown liquid stool.  Musculoskeletal: Normal range of motion.  Neurological: She is alert.  Skin: Skin is warm and dry.  Psychiatric: She has a normal mood and affect.    ED Course  Procedures (including critical care time) Labs Review Labs Reviewed  CBC WITH DIFFERENTIAL - Abnormal; Notable for the following:    RDW 16.0 (*)    Neutrophils Relative % 81 (*)    Neutro Abs 8.0 (*)    All other components within normal limits  COMPREHENSIVE METABOLIC PANEL - Abnormal; Notable for the following:    Potassium 3.4 (*)    Glucose, Bld 109 (*)    Alkaline Phosphatase 35 (*)    All other components within normal limits  URINALYSIS, ROUTINE W REFLEX MICROSCOPIC - Abnormal; Notable for the following:    Specific Gravity, Urine >1.030 (*)    Hgb urine dipstick LARGE (*)    Protein, ur TRACE (*)    Leukocytes, UA  TRACE (*)    All other components within normal limits  URINE MICROSCOPIC-ADD ON - Abnormal; Notable for the following:    Squamous Epithelial / LPF MANY (*)    Bacteria, UA MANY (*)    All other  components within normal limits  URINE CULTURE  LIPASE, BLOOD   Imaging Review No results found.   EKG Interpretation None      MDM   Final diagnoses:  Gastroenteritis    Patients labs and/or radiological studies were viewed and considered during the medical decision making and disposition process. Pt's urine was sent for culture - she is menstruating and this was a clean catch specimen.  Many bacteria but also many squamous cells.  No dysuria, will not tx unless culture comes back with specific specimens.  She was given phenergan per IV,  Along with IV fluids and she had no emesis or diarrhea while in dept.  She was given a dose of bentyl for abdominal cramping,  imodium dose x 1 for diarrhea.  Encouraged f/u with pcp if sx persist.  Pt states has appt with her GI doctor in [place] next week.  Her abdomen is soft, no findings suggesting acute abdomen.  Sx most c/w viral gastroenteritis.  The patient appears reasonably screened and/or stabilized for discharge and I doubt any other medical condition or other [facility] requiring further screening, evaluation, or treatment in the ED at this time prior to discharge.     Burgess AmorJulie Charbel Los, PA-C 06/21/13 0043  Burgess AmorJulie Treasa Bradshaw, PA-C 06/21/13 [phone]

## 2013-06-21 NOTE — ED Notes (Signed)
Pt. Instructed to sip on fluids.

## 2013-06-21 NOTE — Discharge Instructions (Signed)
Viral Gastroenteritis Viral gastroenteritis is also known as stomach flu. This condition affects the stomach and intestinal tract. It can cause sudden diarrhea and vomiting. The illness typically lasts 3 to 8 days. Most people develop an immune response that eventually gets rid of the virus. While this natural response develops, the virus can make you quite ill. CAUSES  Many different viruses can cause gastroenteritis, such as rotavirus or noroviruses. You can catch one of these viruses by consuming contaminated food or water. You may also catch a virus by sharing utensils or other personal items with an infected person or by touching a contaminated surface. SYMPTOMS  The most common symptoms are diarrhea and vomiting. These problems can cause a severe loss of body fluids (dehydration) and a body salt (electrolyte) imbalance. Other symptoms may include:  Fever.  Headache.  Fatigue.  Abdominal pain. DIAGNOSIS  Your caregiver can usually diagnose viral gastroenteritis based on your symptoms and a physical exam. A stool sample may also be taken to test for the presence of viruses or other infections. TREATMENT  This illness typically goes away on its own. Treatments are aimed at rehydration. The most serious cases of viral gastroenteritis involve vomiting so severely that you are not able to keep fluids down. In these cases, fluids must be given through an intravenous line (IV). HOME CARE INSTRUCTIONS   Drink enough fluids to keep your urine clear or pale yellow. Drink small amounts of fluids frequently and increase the amounts as tolerated.  Ask your caregiver for specific rehydration instructions.  Avoid:  Foods high in sugar.  Alcohol.  Carbonated drinks.  Tobacco.  Juice.  Caffeine drinks.  Extremely hot or cold fluids.  Fatty, greasy foods.  Too much intake of anything at one time.  Dairy products until 24 to 48 hours after diarrhea stops.  You may consume probiotics.  Probiotics are active cultures of beneficial bacteria. They may lessen the amount and number of diarrheal stools in adults. Probiotics can be found in yogurt with active cultures and in supplements.  Wash your hands well to avoid spreading the virus.  Only take over-the-counter or prescription medicines for pain, discomfort, or fever as directed by your caregiver. Do not give aspirin to children. Antidiarrheal medicines are not recommended.  Ask your caregiver if you should continue to take your regular prescribed and over-the-counter medicines.  Keep all follow-up appointments as directed by your caregiver. SEEK IMMEDIATE MEDICAL CARE IF:   You are unable to keep fluids down.  You do not urinate at least once every 6 to 8 hours.  You develop shortness of breath.  You notice blood in your stool or vomit. This may look like coffee grounds.  You have abdominal pain that increases or is concentrated in one small area (localized).  You have persistent vomiting or diarrhea.  You have a fever.  The patient is a child younger than 3 months, and he or she has a fever.  The patient is a child older than 3 months, and he or she has a fever and persistent symptoms.  The patient is a child older than 3 months, and he or she has a fever and symptoms suddenly get worse.  The patient is a baby, and he or she has no tears when crying. MAKE SURE YOU:   Understand these instructions.  Will watch your condition.  Will get help right away if you are not doing well or get worse. Document Released: 03/14/2005 Document Revised: 06/06/2011 Document Reviewed: 12/29/2010   Exit[employer] Patient Information 2014 [employer].  

## 2013-06-22 LAB — URINE CULTURE
Colony Count: >=100000
Special Requests: NORMAL

## 2013-06-24 NOTE — ED Provider Notes (Signed)
Medical screening examination/treatment/procedure(s) were performed by non-physician practitioner and as supervising physician I was immediately available for consultation/collaboration.   EKG Interpretation None        Shelda JakesScott W. Citlaly Camplin, MD 06/24/13 [phone]

## 2013-08-12 ENCOUNTER — Encounter (HOSPITAL_COMMUNITY): Payer: Self-pay | Admitting: Emergency Medicine

## 2013-08-12 ENCOUNTER — Emergency Department (HOSPITAL_COMMUNITY): Payer: Medicare Other

## 2013-08-12 ENCOUNTER — Emergency Department (HOSPITAL_COMMUNITY)
Admission: EM | Admit: 2013-08-12 | Discharge: 2013-08-13 | Disposition: A | Discharge status: Home / Self Care | Payer: Medicare Other | Attending: Emergency Medicine | Admitting: Emergency Medicine

## 2013-08-12 DIAGNOSIS — Z87448 Personal history of other diseases of urinary system: Secondary | ICD-10-CM | POA: Insufficient documentation

## 2013-08-12 DIAGNOSIS — J029 Acute pharyngitis, unspecified: Secondary | ICD-10-CM | POA: Insufficient documentation

## 2013-08-12 DIAGNOSIS — K219 Gastro-esophageal reflux disease without esophagitis: Secondary | ICD-10-CM | POA: Insufficient documentation

## 2013-08-12 DIAGNOSIS — F17200 Nicotine dependence, unspecified, uncomplicated: Secondary | ICD-10-CM | POA: Insufficient documentation

## 2013-08-12 DIAGNOSIS — Z9851 Tubal ligation status: Secondary | ICD-10-CM | POA: Insufficient documentation

## 2013-08-12 DIAGNOSIS — K5090 Crohn's disease, unspecified, without complications: Secondary | ICD-10-CM | POA: Insufficient documentation

## 2013-08-12 DIAGNOSIS — R109 Unspecified abdominal pain: Secondary | ICD-10-CM

## 2013-08-12 DIAGNOSIS — Z9049 Acquired absence of other specified parts of digestive tract: Secondary | ICD-10-CM | POA: Insufficient documentation

## 2013-08-12 DIAGNOSIS — Z79899 Other long term (current) drug therapy: Secondary | ICD-10-CM | POA: Insufficient documentation

## 2013-08-12 DIAGNOSIS — Z9889 Other specified postprocedural states: Secondary | ICD-10-CM | POA: Insufficient documentation

## 2013-08-12 DIAGNOSIS — Z3202 Encounter for pregnancy test, result negative: Secondary | ICD-10-CM | POA: Insufficient documentation

## 2013-08-12 DIAGNOSIS — G8929 Other chronic pain: Secondary | ICD-10-CM | POA: Insufficient documentation

## 2013-08-12 DIAGNOSIS — I10 Essential (primary) hypertension: Secondary | ICD-10-CM | POA: Insufficient documentation

## 2013-08-12 MED ORDER — FENTANYL CITRATE 0.05 MG/ML IJ SOLN
50.0000 ug | INTRAMUSCULAR | Status: DC | PRN
  Administered 2013-08-13 (×2): 50 ug via INTRAVENOUS
  Filled 2013-08-12 (×2): qty 2

## 2013-08-12 MED ORDER — ONDANSETRON HCL 4 MG/2ML IJ SOLN
4.0000 mg | Freq: Once | INTRAMUSCULAR | Status: AC
  Administered 2013-08-13: 4 mg via INTRAVENOUS
  Filled 2013-08-12: qty 2

## 2013-08-12 NOTE — ED Provider Notes (Signed)
CSN: [id number]     Arrival date & time 08/12/13  2218 History   First MD Initiated Contact with Patient 08/12/13 2311 This chart was scribed for Sunnie Nielsen, MD by Valera Castle, ED Scribe. This patient was seen in room APA07/APA07 and the patient's care was started at 11:39 PM.  Chief Complaint  Patient presents with  . Abdominal Pain   (Consider location/radiation/quality/duration/timing/severity/associated sxs/prior Treatment) Patient is a 38 y.o. female presenting with abdominal pain. The history is provided by the patient. No language interpreter was used.  Abdominal Pain Pain location:  Epigastric Pain severity:  Moderate Duration:  3 days Timing:  Intermittent Associated symptoms: cough, nausea and sore throat   Associated symptoms: no fever and no vomiting    HPI Comments: Samantha Page is a 37 y.o. female with h/o Crohn's disease, renal disorder, and GERD who presents to the Emergency Department complaining of intermittent, epigastric abdominal pain onset 3 days ago, with associated cough and nausea. She reports her coughing will exacerbate her abdominal pain. She reports some blood in her vomit. She has colostomy in place, has had to change her bag more than usual. She denies fever, vomiting, and any other associated symptoms. She denies being on any medications for her Crohn's disease at this time.   PCP - Arlina Robes, MD  Past Medical History  Diagnosis Date  . Back pain, chronic   . Crohn disease   . Hypertension   . Gastroesophageal reflux   . Renal disorder    Past Surgical History  Procedure Laterality Date  . Colectomy    . Tubal ligation    . Tonsillectomy    . Uterine ablation    . Colostomy     History reviewed. No pertinent family history. History  Substance Use Topics  . Smoking status: Current Every Day Smoker  . Smokeless tobacco: Not on file  . Alcohol Use: No   OB History   Grav Para Term Preterm Abortions TAB SAB Ect Mult Living                  Review of Systems  Constitutional: Negative for fever.  HENT: Positive for sore throat.   Respiratory: Positive for cough.   Gastrointestinal: Positive for nausea and abdominal pain. Negative for vomiting.  All other systems reviewed and are negative.  Allergies  Darvocet; Ibuprofen; Remicade; Tramadol; Erythromycin; and Sulfa antibiotics  Home Medications   Prior to Admission medications   Medication Sig Start Date End Date Taking? Authorizing Provider  ALPRAZolam Prudy Feeler) 0.5 MG tablet Take 0.5 mg by mouth at bedtime as needed for sleep.   Yes Historical Provider, MD  balsalazide (COLAZAL) 750 MG capsule Take 2,250 mg by mouth 2 (two) times daily.   Yes Historical Provider, MD  cyclobenzaprine (FLEXERIL) 10 MG tablet Take 10 mg by mouth 3 (three) times daily as needed for muscle spasms.   Yes Historical Provider, MD  dextroamphetamine (DEXEDRINE SPANSULE) 15 MG 24 hr capsule Take 30 mg by mouth daily.   Yes Historical Provider, MD  diphenoxylate-atropine (LOMOTIL) 2.5-0.025 MG per tablet Take 1 tablet by mouth daily as needed for diarrhea or loose stools.  01/28/13  Yes Historical Provider, MD  fentaNYL (DURAGESIC - DOSED MCG/HR) 25 MCG/HR patch Place 25 mcg onto the skin every 3 (three) days.   Yes Historical Provider, MD  lansoprazole (PREVACID) 30 MG capsule Take 30 mg by mouth daily.   Yes Historical Provider, MD  losartan (COZAAR) 25 MG tablet Take  25 mg by mouth daily. 06/05/13  Yes Historical Provider, MD  ondansetron (ZOFRAN) 4 MG tablet Take 1 tablet (4 mg total) by mouth every 6 (six) hours as needed for nausea or vomiting. 05/26/13  Yes Dione Boozeavid Glick, MD  promethazine (PHENERGAN) 25 MG tablet Take 1 tablet (25 mg total) by mouth every 6 (six) hours as needed for nausea or vomiting. 05/03/13  Yes Shelda JakesScott W. Zackowski, MD   BP 146/95  Pulse 92  Temp(Src) 99.3 F (37.4 C) (Oral)  Resp 20  Ht 5' (1.524 m)  Wt 185 lb (83.915 kg)  BMI 36.13 kg/m2  SpO2 98%  LMP  07/29/2013  Physical Exam  Nursing note and vitals reviewed. Constitutional: She is oriented to person, place, and time. She appears well-developed and well-nourished. No distress.  HENT:  Head: Normocephalic and atraumatic.  Eyes: EOM are normal.  Neck: Neck supple.  Cardiovascular: Normal rate.   Pulmonary/Chest: Effort normal. No respiratory distress.  Abdominal: Soft. There is tenderness. There is guarding.  Epigastric tenderness with voluntary guarding. Colostomy in place.   Musculoskeletal: Normal range of motion.  Neurological: She is alert and oriented to person, place, and time.  Skin: Skin is warm and dry.  Psychiatric: She has a normal mood and affect. Her behavior is normal.    ED Course  Procedures (including critical care time) DIAGNOSTIC STUDIES: Oxygen Saturation is 98% on room air, normal by my interpretation.    COORDINATION OF CARE: 11:43 PM-Discussed treatment plan which includes nausea medication, CT abdomen, UA, and blood work with pt at bedside and pt agreed to plan.   Results for orders placed during the hospital encounter of 08/12/13  URINALYSIS, ROUTINE W REFLEX MICROSCOPIC      Result Value Ref Range   Color, Urine YELLOW  YELLOW   APPearance CLEAR  CLEAR   Specific Gravity, Urine 1.025  1.005 - 1.030   pH 6.0  5.0 - 8.0   Glucose, UA NEGATIVE  NEGATIVE mg/dL   Hgb urine dipstick TRACE (*) NEGATIVE   Bilirubin Urine NEGATIVE  NEGATIVE   Ketones, ur NEGATIVE  NEGATIVE mg/dL   Protein, ur NEGATIVE  NEGATIVE mg/dL   Urobilinogen, UA 0.2  0.0 - 1.0 mg/dL   Nitrite NEGATIVE  NEGATIVE   Leukocytes, UA NEGATIVE  NEGATIVE  CBC      Result Value Ref Range   WBC 11.2 (*) 4.0 - 10.5 K/uL   RBC 4.50  3.87 - 5.11 MIL/uL   Hemoglobin 12.1  12.0 - 15.0 g/dL   HCT 21.336.6  08.636.0 - 57.846.0 %   MCV 81.3  78.0 - 100.0 fL   MCH 26.9  26.0 - 34.0 pg   MCHC 33.1  30.0 - 36.0 g/dL   RDW 46.915.8 (*) 62.911.5 - 52.815.5 %   Platelets 325  150 - 400 K/uL  COMPREHENSIVE METABOLIC  PANEL      Result Value Ref Range   Sodium 137  137 - 147 mEq/L   Potassium 3.4 (*) 3.7 - 5.3 mEq/L   Chloride 102  96 - 112 mEq/L   CO2 24  19 - 32 mEq/L   Glucose, Bld 99  70 - 99 mg/dL   BUN 16  6 - 23 mg/dL   Creatinine, Ser 4.130.81  0.50 - 1.10 mg/dL   Calcium 8.8  8.4 - 24.410.5 mg/dL   Total Protein 7.0  6.0 - 8.3 g/dL   Albumin 3.1 (*) 3.5 - 5.2 g/dL   AST 10  0 -  37 U/L   ALT 8  0 - 35 U/L   Alkaline Phosphatase 36 (*) 39 - 117 U/L   Total Bilirubin <0.2 (*) 0.3 - 1.2 mg/dL   GFR calc non Af Amer >90  >90 mL/min   GFR calc Af Amer >90  >90 mL/min  LIPASE, BLOOD      Result Value Ref Range   Lipase 29  11 - 59 U/L  PREGNANCY, URINE      Result Value Ref Range   Preg Test, Ur NEGATIVE  NEGATIVE  URINE MICROSCOPIC-ADD ON      Result Value Ref Range   Squamous Epithelial / LPF MANY (*) RARE   WBC, UA 0-2  <3 WBC/hpf   RBC / HPF 0-2  <3 RBC/hpf   Bacteria, UA MANY (*) RARE   Ct Abdomen Pelvis W Contrast  08/13/2013   CLINICAL DATA:  Abdominal pain, Crohn's disease, colostomy and colectomy.  EXAM: CT ABDOMEN AND PELVIS WITH CONTRAST  TECHNIQUE: Multidetector CT imaging of the abdomen and pelvis was performed using the standard protocol following bolus administration of intravenous contrast.  CONTRAST:  50mL OMNIPAQUE IOHEXOL 300 MG/ML SOLN, OMNIPAQUE IOHEXOL 300 MG/ML SOLN  COMPARISON:  CT ABD/PELV WO CM dated 05/26/2013; CT ABD-PELV W/ CM dated 05/19/2009; CT ABD - PELV W/ CM dated 05/03/2013  FINDINGS: Included view of the lung bases are clear. Visualized heart and pericardium are unremarkable.  The liver, gallbladder, pancreas and adrenal glands are unremarkable. Hypoenhancing spleen, with peripheral rim of enhancing parenchyma, axial 17/97.  Right parasagittal colostomy with peristomal colonic redundancy, unchanged. Status post partial colectomy. Extensive inflammatory changes about the cecum and ascending colon with wall thickening and possible stricture. Subtle linear density along  the medial aspect of the inflamed bowel, coronal 50/ 102, sagittal 55/ 140. Surrounding small lymph nodes. The stomach, small bowel are normal in course and caliber without inflammatory changes. Small amount of free fluid in the pelvis is likely physiologic.  Kidneys are orthotopic, demonstrating symmetric enhancement without nephrolithiasis, or renal masses. Bilateral extrarenal pelvises. Mild right hydronephrosis suspected. The unopacified ureters are normal in course and caliber. Urinary bladder is partially distended and unremarkable.  Aortoiliac vessels are normal in course and caliber. No lymphadenopathy by CT size criteria. Internal reproductive organs are unremarkable. The soft tissues and included osseous structures are nonsuspicious.  IMPRESSION: Status post right lower quadrant colostomy, with marked inflammatory changes about the cecum and ascending colon with suspected stricture concerning for acute on chronic Crohn's disease, no bowel perforation or abscess. Subtle linear density along the medial aspect of the inflamed bowel, unclear if this reflects a vascular structure, or possible fistula.  Bilateral extrarenal pelvises with suspected mild right hydronephrosis, it is unclear if this is reactive related to colonic process.   Electronically Signed   By: Awilda Metro   On: 08/13/2013 02:26     EKG Interpretation None     Medications  fentaNYL (SUBLIMAZE) injection 50 mcg (50 mcg Intravenous Given 08/13/13 0017)  ondansetron (ZOFRAN) injection 4 mg (4 mg Intravenous Given 08/13/13 0017)    3:44 AM CT results shared with patient as above. She still having some mild pain but very much prefers to be discharged home and followup with her physician in [place].  She agrees to strict return precautions. Prescriptions provided. Plan followup 48 hours.   MDM   Diagnosis: Crohn's flare, abdominal pain  Patient with history of Crohn's, previous colectomy, presents with abdominal pain. No  fevers. Labs reviewed  as above. Mild leukocytosis. CT scan obtained and reviewed as above. Improved with IV narcotics and medications. Vital signs and nursing notes reviewed and considered.  I personally performed the services described in this documentation, which was scribed in my presence. The recorded information has been reviewed and is accurate.     Sunnie Nielsen, MD 08/13/13 [phone]

## 2013-08-12 NOTE — ED Notes (Signed)
abd pain, cough, nausea, Pt has colostomy, says she has had to change the bag more than usual.   Lt flank pain, sore throat.

## 2013-08-13 LAB — URINE MICROSCOPIC-ADD ON

## 2013-08-13 LAB — COMPREHENSIVE METABOLIC PANEL
ALT: 8 U/L (ref 0–35)
AST: 10 U/L (ref 0–37)
Albumin: 3.1 g/dL — ABNORMAL LOW (ref 3.5–5.2)
Alkaline Phosphatase: 36 U/L — ABNORMAL LOW (ref 39–117)
BUN: 16 mg/dL (ref 6–23)
CO2: 24 mEq/L (ref 19–32)
Calcium: 8.8 mg/dL (ref 8.4–10.5)
Chloride: 102 mEq/L (ref 96–112)
Creatinine, Ser: 0.81 mg/dL (ref 0.50–1.10)
GFR calc Af Amer: >90 mL/min (ref >90)
GFR calc non Af Amer: >90 mL/min (ref >90)
Glucose, Bld: 99 mg/dL (ref 70–99)
Potassium: 3.4 mEq/L — ABNORMAL LOW (ref 3.7–5.3)
Sodium: 137 mEq/L (ref 137–147)
Total Bilirubin: <0.2 mg/dL — ABNORMAL LOW (ref 0.3–1.2)
Total Protein: 7 g/dL (ref 6.0–8.3)

## 2013-08-13 LAB — URINALYSIS, ROUTINE W REFLEX MICROSCOPIC
Bilirubin Urine: NEGATIVE
Glucose, UA: NEGATIVE mg/dL
Ketones, ur: NEGATIVE mg/dL
Leukocytes, UA: NEGATIVE
Nitrite: NEGATIVE
Protein, ur: NEGATIVE mg/dL
Specific Gravity, Urine: 1.025 (ref 1.005–1.030)
Urobilinogen, UA: 0.2 mg/dL (ref 0.0–1.0)
pH: 6 (ref 5.0–8.0)

## 2013-08-13 LAB — CBC
HCT: 36.6 % (ref 36.0–46.0)
Hemoglobin: 12.1 g/dL (ref 12.0–15.0)
MCH: 26.9 pg (ref 26.0–34.0)
MCHC: 33.1 g/dL (ref 30.0–36.0)
MCV: 81.3 fL (ref 78.0–100.0)
Platelets: 325 10*3/uL (ref 150–400)
RBC: 4.5 MIL/uL (ref 3.87–5.11)
RDW: 15.8 % — ABNORMAL HIGH (ref 11.5–15.5)
WBC: 11.2 10*3/uL — ABNORMAL HIGH (ref 4.0–10.5)

## 2013-08-13 LAB — PREGNANCY, URINE: Preg Test, Ur: NEGATIVE

## 2013-08-13 LAB — LIPASE, BLOOD: Lipase: 29 U/L (ref 11–59)

## 2013-08-13 MED ORDER — IOHEXOL 300 MG/ML  SOLN
100.0000 mL | Freq: Once | INTRAMUSCULAR | Status: AC | PRN
  Administered 2013-08-13: 100 mL via INTRAVENOUS

## 2013-08-13 MED ORDER — OXYCODONE-ACETAMINOPHEN 5-325 MG PO TABS: 2.0000 | ORAL_TABLET | ORAL | Status: DC | PRN

## 2013-08-13 MED ORDER — PROMETHAZINE HCL 25 MG PO TABS: 25.0000 mg | ORAL_TABLET | Freq: Four times a day (QID) | ORAL | Status: DC | PRN

## 2013-08-13 MED ORDER — IOHEXOL 300 MG/ML  SOLN
50.0000 mL | Freq: Once | INTRAMUSCULAR | Status: AC | PRN
  Administered 2013-08-13: 50 mL via ORAL

## 2013-08-13 MED ORDER — METHYLPREDNISOLONE 4 MG PO KIT: PACK | ORAL | Status: DC

## 2013-08-13 MED ORDER — HYDROMORPHONE HCL PF 1 MG/ML IJ SOLN
1.0000 mg | Freq: Once | INTRAMUSCULAR | Status: AC
  Administered 2013-08-13: 1 mg via INTRAVENOUS
  Filled 2013-08-13: qty 1

## 2013-08-13 NOTE — Discharge Instructions (Signed)
Abdominal Pain, Adult °Many things can cause abdominal pain. Usually, abdominal pain is not caused by a disease and will improve without treatment. It can often be observed and treated at home. Your health care provider will do a physical exam and possibly order blood tests and X-rays to help determine the seriousness of your pain. However, in many cases, more time must pass before a clear cause of the pain can be found. Before that point, your health care provider may not know if you need more testing or further treatment. °HOME CARE INSTRUCTIONS  °Monitor your abdominal pain for any changes. The following actions may help to alleviate any discomfort you are experiencing: °· Only take over-the-counter or prescription medicines as directed by your health care provider. °· Do not take laxatives unless directed to do so by your health care provider. °· Try a clear liquid diet (broth, tea, or water) as directed by your health care provider. Slowly move to a bland diet as tolerated. °SEEK MEDICAL CARE IF: °· You have unexplained abdominal pain. °· You have abdominal pain associated with nausea or diarrhea. °· You have pain when you urinate or have a bowel movement. °· You experience abdominal pain that wakes you in the night. °· You have abdominal pain that is worsened or improved by eating food. °· You have abdominal pain that is worsened with eating fatty foods. °SEEK IMMEDIATE MEDICAL CARE IF:  °· Your pain does not go away within 2 hours. °· You have a fever. °· You keep throwing up (vomiting). °· Your pain is felt only in portions of the abdomen, such as the right side or the left lower portion of the abdomen. °· You pass bloody or black tarry stools. °MAKE SURE YOU: °· Understand these instructions.   °· Will watch your condition.   °· Will get help right away if you are not doing well or get worse.   °Document Released: 12/22/2004 Document Revised: 01/02/2013 Document Reviewed: 11/21/2012 °[employer]® Patient  Information ©2014 [employer]. ° °

## 2013-08-13 NOTE — ED Notes (Signed)
Pt reports she has been drinking already. States at times it makes her feel nauseous but is able to tolerate it. No episodes of emesis.

## 2013-09-21 ENCOUNTER — Encounter (HOSPITAL_COMMUNITY): Payer: Self-pay | Admitting: Emergency Medicine

## 2013-09-21 ENCOUNTER — Emergency Department (HOSPITAL_COMMUNITY): Payer: Medicare Other

## 2013-09-21 ENCOUNTER — Emergency Department (HOSPITAL_COMMUNITY)
Admission: EM | Admit: 2013-09-21 | Discharge: 2013-09-21 | Disposition: A | Discharge status: Home / Self Care | Payer: Medicare Other | Attending: Emergency Medicine | Admitting: Emergency Medicine

## 2013-09-21 DIAGNOSIS — G8929 Other chronic pain: Secondary | ICD-10-CM | POA: Insufficient documentation

## 2013-09-21 DIAGNOSIS — Z79899 Other long term (current) drug therapy: Secondary | ICD-10-CM | POA: Insufficient documentation

## 2013-09-21 DIAGNOSIS — F17200 Nicotine dependence, unspecified, uncomplicated: Secondary | ICD-10-CM | POA: Insufficient documentation

## 2013-09-21 DIAGNOSIS — Z3202 Encounter for pregnancy test, result negative: Secondary | ICD-10-CM | POA: Insufficient documentation

## 2013-09-21 DIAGNOSIS — K219 Gastro-esophageal reflux disease without esophagitis: Secondary | ICD-10-CM | POA: Insufficient documentation

## 2013-09-21 DIAGNOSIS — Z87448 Personal history of other diseases of urinary system: Secondary | ICD-10-CM | POA: Insufficient documentation

## 2013-09-21 DIAGNOSIS — I10 Essential (primary) hypertension: Secondary | ICD-10-CM | POA: Insufficient documentation

## 2013-09-21 DIAGNOSIS — K5090 Crohn's disease, unspecified, without complications: Secondary | ICD-10-CM

## 2013-09-21 DIAGNOSIS — Z9089 Acquired absence of other organs: Secondary | ICD-10-CM | POA: Insufficient documentation

## 2013-09-21 DIAGNOSIS — Z9889 Other specified postprocedural states: Secondary | ICD-10-CM | POA: Insufficient documentation

## 2013-09-21 DIAGNOSIS — IMO0002 Reserved for concepts with insufficient information to code with codable children: Secondary | ICD-10-CM | POA: Insufficient documentation

## 2013-09-21 DIAGNOSIS — Z9851 Tubal ligation status: Secondary | ICD-10-CM | POA: Insufficient documentation

## 2013-09-21 LAB — CBC WITH DIFFERENTIAL/PLATELET
Basophils Absolute: 0.1 10*3/uL (ref 0.0–0.1)
Basophils Relative: 1 % (ref 0–1)
Eosinophils Absolute: 0.3 10*3/uL (ref 0.0–0.7)
Eosinophils Relative: 3 % (ref 0–5)
HCT: 36.5 % (ref 36.0–46.0)
Hemoglobin: 12.2 g/dL (ref 12.0–15.0)
Lymphocytes Relative: 30 % (ref 12–46)
Lymphs Abs: 3.1 10*3/uL (ref 0.7–4.0)
MCH: 26.9 pg (ref 26.0–34.0)
MCHC: 33.4 g/dL (ref 30.0–36.0)
MCV: 80.6 fL (ref 78.0–100.0)
Monocytes Absolute: 0.5 10*3/uL (ref 0.1–1.0)
Monocytes Relative: 4 % (ref 3–12)
Neutro Abs: 6.4 10*3/uL (ref 1.7–7.7)
Neutrophils Relative %: 62 % (ref 43–77)
Platelets: 289 10*3/uL (ref 150–400)
RBC: 4.53 MIL/uL (ref 3.87–5.11)
RDW: 15.7 % — ABNORMAL HIGH (ref 11.5–15.5)
WBC: 10.3 10*3/uL (ref 4.0–10.5)

## 2013-09-21 LAB — URINALYSIS, ROUTINE W REFLEX MICROSCOPIC
Bilirubin Urine: NEGATIVE
Glucose, UA: NEGATIVE mg/dL
Hgb urine dipstick: NEGATIVE
Ketones, ur: NEGATIVE mg/dL
Nitrite: NEGATIVE
Protein, ur: NEGATIVE mg/dL
Specific Gravity, Urine: 1.02 (ref 1.005–1.030)
Urobilinogen, UA: 0.2 mg/dL (ref 0.0–1.0)
pH: 6.5 (ref 5.0–8.0)

## 2013-09-21 LAB — BASIC METABOLIC PANEL
BUN: 15 mg/dL (ref 6–23)
CO2: 28 mEq/L (ref 19–32)
Calcium: 9 mg/dL (ref 8.4–10.5)
Chloride: 102 mEq/L (ref 96–112)
Creatinine, Ser: 0.84 mg/dL (ref 0.50–1.10)
GFR calc Af Amer: >90 mL/min (ref >90)
GFR calc non Af Amer: 88 mL/min — ABNORMAL LOW (ref >90)
Glucose, Bld: 99 mg/dL (ref 70–99)
Potassium: 3.4 mEq/L — ABNORMAL LOW (ref 3.7–5.3)
Sodium: 141 mEq/L (ref 137–147)

## 2013-09-21 LAB — URINE MICROSCOPIC-ADD ON

## 2013-09-21 LAB — PREGNANCY, URINE: Preg Test, Ur: NEGATIVE

## 2013-09-21 LAB — LIPASE, BLOOD: Lipase: 29 U/L (ref 11–59)

## 2013-09-21 MED ORDER — HYDROMORPHONE HCL PF 1 MG/ML IJ SOLN
1.0000 mg | Freq: Once | INTRAMUSCULAR | Status: AC
  Administered 2013-09-21: 1 mg via INTRAVENOUS
  Filled 2013-09-21: qty 1

## 2013-09-21 MED ORDER — CLINDAMYCIN HCL 300 MG PO CAPS: 300.0000 mg | ORAL_CAPSULE | Freq: Three times a day (TID) | ORAL | Status: DC

## 2013-09-21 MED ORDER — PREDNISONE 10 MG PO TABS: 50.0000 mg | ORAL_TABLET | Freq: Every day | ORAL | Status: DC

## 2013-09-21 MED ORDER — ONDANSETRON HCL 4 MG/2ML IJ SOLN
4.0000 mg | Freq: Once | INTRAMUSCULAR | Status: AC
  Administered 2013-09-21: 4 mg via INTRAVENOUS
  Filled 2013-09-21: qty 2

## 2013-09-21 MED ORDER — OXYCODONE-ACETAMINOPHEN 7.5-325 MG PO TABS: 1.0000 | ORAL_TABLET | ORAL | Status: DC | PRN

## 2013-09-21 NOTE — ED Notes (Signed)
Patient c/o abd pain that radiates into back with nausea and vomiting. 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 Patient has colostomy. Patient reports taking phenergan for nausea with some relief. Per patient has appointment with MRI and specialist on July 14th.

## 2013-09-21 NOTE — Discharge Instructions (Signed)
Crohn's Disease  Crohn's disease is a long-term (chronic) soreness and redness (inflammation) of the intestines (bowel). It can affect any portion of the digestive tract, from the mouth to the anus. It can also cause problems outside the digestive tract. Crohn's disease is closely related to a disease called ulcerative colitis (together, these two diseases are called inflammatory bowel disease).   CAUSES   The cause of Crohn's disease is not known. One theory is that, in an easily affected person, the immune system is triggered to attack the body's own digestive tissue. Crohn's disease runs in families. It seems to be more common in certain geographic areas and amongst certain races. There are no clear-cut dietary causes.   SYMPTOMS   Crohn's disease can cause many different symptoms since it can affect many different parts of the body. Symptoms include:  · Fatigue.  · Weight loss.  · Chronic diarrhea, sometime bloody.  · Abdominal pain and cramps.  · Fever.  · Ulcers or canker sores in the mouth or rectum.  · Anemia (low red blood cells).  · Arthritis, skin problems, and eye problems may occur.  Complications of Crohn's disease can include:  · Series of holes (perforation) of the bowel.  · Portions of the intestines sticking to each other (adhesions).  · Obstruction of the bowel.  · Fistula formation, typically in the rectal area but also in other areas. A fistula is an opening between the bowels and the outside, or between the bowels and another organ.  · A painful crack in the mucous membrane of the anus (rectal fissure).  DIAGNOSIS   Your caregiver may suspect Crohn's disease based on your symptoms and an exam. Blood tests may confirm that there is a problem. You may be asked to submit a stool specimen for examination. X-rays and CT scans may be necessary. Ultimately, the diagnosis is usually made after a procedure that uses a flexible tube that is inserted via your mouth or your anus. This is done under  sedation and is called either an upper endoscopy or colonoscopy. With these tests, the specialist can take tiny tissue samples and remove them from the inside of the bowel (biopsy). Examination of this biopsy tissue under a microscope can reveal Crohn's disease as the cause of your symptoms.  Due to the many different forms that Crohn's disease can take, symptoms may be present for several years before a diagnosis is made.  TREATMENT   Medications are often used to decrease inflammation and control the immune system. These include medicines related to aspirin, steroid medications, and newer and stronger medications to slow down the immune system. Some medications may be used as suppositories or enemas. A number of other medications are used or have been studied. Your caregiver will make specific recommendations.  HOME CARE INSTRUCTIONS   · Symptoms such as diarrhea can be controlled with medications. Avoid foods that have a laxative effect such as fresh fruit, vegetables and dairy products. During flare ups, you can rest your bowel by refraining from solid foods. Drink clear liquids frequently during the day (electrolyte or re-hydrating fluids are best. Your caregiver can help you with suggestions). Drink often to prevent loss of body fluids (dehydration). When diarrhea has cleared, eat small meals and more frequently. Avoid food additives and stimulants such as caffeine (coffee, tea, or chocolate). Enzyme supplements may help if you develop intolerance to a sugar in dairy products (lactose). Ask your caregiver or dietitian about specific dietary instructions.  · Try   to maintain a positive attitude. Learn relaxation techniques such as self hypnosis, mental imaging, and muscle relaxation.  · If possible, avoid stresses which can aggravate your condition.  · Exercise regularly.  · Follow your diet.  · Always get plenty of rest.  SEEK MEDICAL CARE IF:   · Your symptoms fail to improve after a week or two of new  treatment.  · You experience continued weight loss.  · You have ongoing cramps or loose bowels.  · You develop a new skin rash, skin sores, or eye problems.  SEEK IMMEDIATE MEDICAL CARE IF:   · You have worsening of your symptoms or develop new symptoms.  · You have a fever.  · You develop bloody diarrhea.  · You develop severe abdominal pain.  MAKE SURE YOU:   · Understand these instructions.  · Will watch your condition.  · Will get help right away if you are not doing well or get worse.  Document Released: 12/22/2004 Document Revised: 07/09/2012 Document Reviewed: 11/20/2006  ExitCare® Patient Information ©2015 [employer]. This information is not intended to replace advice given to you by your health care provider. Make sure you discuss any questions you have with your health care provider.

## 2013-09-21 NOTE — ED Provider Notes (Signed)
CSN: [id number]     Arrival date & time 09/21/13  1819 History   First MD Initiated Contact with Patient 09/21/13 1904   This chart was scribed for Nelia Shiobert L Addalee Kavanagh, MD by Gwenevere AbbotAlexis Brown, ED scribe. This patient was seen in room [facility] and the patient's care was started at 7:16 PM.    Chief Complaint  Patient presents with  . Abdominal Pain   The history is provided by the patient. No language interpreter was used.  HPI Comments:  Samantha Page is a 38 y.o. female with h/o Crohn's disease, who presents to the Emergency Department complaining of constant, severe, abdominal pain, that radiates to her left back, onset 4 days ago, with associated, nausea, vomiting and a hernia. Pt has a colostomy bag. Pt was previously taking medication for Chron's Disease, but no longer sees that doctor. Pt denies having taken any medication recently for Crohn's Disease.  Pt has been seeing [place]ologist in [place] for approximately 6 months, with the last visit being in February. She states she missed two appointments due to transportation issues. Pt states that she has an appointment scheduled for July 14th. Pt reports having a CT done here at [facility] last month.She denies having been seen at another ED since her CT scan last month. She denies fever, and any other associated symptoms. Pt reports taking medication for ADD,  Xanax PRN, and medication for GERD. Pt has h/o HTN and takes medication regularly for this, but denies any other medical issues. Pt reports smoking daily.   Past Medical History  Diagnosis Date  . Back pain, chronic   . Crohn disease   . Hypertension   . Gastroesophageal reflux   . Renal disorder    Past Surgical History  Procedure Laterality Date  . Colectomy    . Tubal ligation    . Tonsillectomy    . Uterine ablation    . Colostomy     Family History  Problem Relation Age of Onset  . Diabetes Mother   . Hypertension Mother    History  Substance Use Topics  . Smoking  status: Current Every Day Smoker -- 0.50 packs/day for 16 years    Types: Cigarettes  . Smokeless tobacco: Never Used  . Alcohol Use: No   OB History   Grav Para Term Preterm Abortions TAB SAB Ect Mult Living   5 1  1 4  4   1      Review of Systems 10 Systems reviewed and are negative for acute change except as noted in the HPI.   Allergies  Darvocet; Ibuprofen; Polyethylene glycol; Remicade; Tramadol; Erythromycin; Mesalamine; and Sulfa antibiotics  Home Medications   Prior to Admission medications   Medication Sig Start Date End Date Taking? Authorizing Provider  ALPRAZolam Prudy Feeler(XANAX) 0.5 MG tablet Take 0.5 mg by mouth at bedtime as needed for sleep.   Yes Historical Provider, MD  cyclobenzaprine (FLEXERIL) 10 MG tablet Take 10 mg by mouth 3 (three) times daily as needed for muscle spasms.   Yes Historical Provider, MD  dextroamphetamine (DEXEDRINE SPANSULE) 15 MG 24 hr capsule Take 30 mg by mouth daily.   Yes Historical Provider, MD  diphenoxylate-atropine (LOMOTIL) 2.5-0.025 MG per tablet Take 1 tablet by mouth daily as needed for diarrhea or loose stools.  01/28/13  Yes Historical Provider, MD  gabapentin (NEURONTIN) 300 MG capsule Take 300 mg by mouth 3 (three) times daily. 08/26/13  Yes Historical Provider, MD  lansoprazole (PREVACID) 30 MG capsule  Take 30 mg by mouth daily.   Yes Historical Provider, MD  losartan (COZAAR) 25 MG tablet Take 25 mg by mouth daily. 06/05/13  Yes Historical Provider, MD  promethazine (PHENERGAN) 25 MG tablet Take 1 tablet (25 mg total) by mouth every 6 (six) hours as needed for nausea or vomiting. 08/13/13  Yes Sunnie Nielsen, MD  balsalazide (COLAZAL) 750 MG capsule Take 2,250 mg by mouth 2 (two) times daily.    Historical Provider, MD  oxyCODONE-acetaminophen (PERCOCET) 7.5-325 MG per tablet Take 1 tablet by mouth every 4 (four) hours as needed for pain. 09/21/13   Nelia Shi, MD  predniSONE (DELTASONE) 10 MG tablet Take 5 tablets (50 mg total) by mouth  daily. 09/21/13   Nelia Shi, MD   BP 140/115  Pulse 86  Temp(Src) 98.3 F (36.8 C) (Oral)  Resp 18  Ht 5' (1.524 m)  Wt 195 lb 1.6 oz (88.497 kg)  BMI 38.10 kg/m2  SpO2 99%  LMP 09/14/2013 Physical Exam  Nursing note and vitals reviewed. Constitutional: She is oriented to person, place, and time. She appears well-developed and well-nourished. No distress.  HENT:  Head: Normocephalic and atraumatic.  Eyes: Pupils are equal, round, and reactive to light.  Neck: Normal range of motion.  Cardiovascular: Normal rate and intact distal pulses.   Pulmonary/Chest: No respiratory distress.  Abdominal: Normal appearance. She exhibits no distension. There is tenderness (diffuse, mild). There is no rigidity, no rebound and no guarding.    Musculoskeletal: Normal range of motion.  Neurological: She is alert and oriented to person, place, and time. No cranial nerve deficit.  Skin: Skin is warm and dry. No rash noted.  Psychiatric: She has a normal mood and affect. Her behavior is normal.    ED Course  Procedures (including critical care time) Medications  ondansetron (ZOFRAN) injection 4 mg (4 mg Intravenous Given 09/21/13 1942)  HYDROmorphone (DILAUDID) injection 1 mg (1 mg Intravenous Given 09/21/13 1942)  HYDROmorphone (DILAUDID) injection 1 mg (1 mg Intravenous Given 09/21/13 2048)     DIAGNOSTIC STUDIES: Oxygen Saturation is 99% on RA, normal by my interpretation.  COORDINATION OF CARE: 7:27 PM-Discussed treatment plan with pt at bedside and pt agreed to plan.  Results for orders placed during the hospital encounter of 09/21/13  CBC WITH DIFFERENTIAL      Result Value Ref Range   WBC 10.3  4.0 - 10.5 K/uL   RBC 4.53  3.87 - 5.11 MIL/uL   Hemoglobin 12.2  12.0 - 15.0 g/dL   HCT 16.1  09.6 - 04.5 %   MCV 80.6  78.0 - 100.0 fL   MCH 26.9  26.0 - 34.0 pg   MCHC 33.4  30.0 - 36.0 g/dL   RDW 40.9 (*) 81.1 - 91.4 %   Platelets 289  150 - 400 K/uL   Neutrophils Relative % 62   43 - 77 %   Neutro Abs 6.4  1.7 - 7.7 K/uL   Lymphocytes Relative 30  12 - 46 %   Lymphs Abs 3.1  0.7 - 4.0 K/uL   Monocytes Relative 4  3 - 12 %   Monocytes Absolute 0.5  0.1 - 1.0 K/uL   Eosinophils Relative 3  0 - 5 %   Eosinophils Absolute 0.3  0.0 - 0.7 K/uL   Basophils Relative 1  0 - 1 %   Basophils Absolute 0.1  0.0 - 0.1 K/uL  BASIC METABOLIC PANEL      Result Value  Ref Range   Sodium 141  137 - 147 mEq/L   Potassium 3.4 (*) 3.7 - 5.3 mEq/L   Chloride 102  96 - 112 mEq/L   CO2 28  19 - 32 mEq/L   Glucose, Bld 99  70 - 99 mg/dL   BUN 15  6 - 23 mg/dL   Creatinine, Ser 7.82  0.50 - 1.10 mg/dL   Calcium 9.0  8.4 - 95.6 mg/dL   GFR calc non Af Amer 88 (*) >90 mL/min   GFR calc Af Amer >90  >90 mL/min  PREGNANCY, URINE      Result Value Ref Range   Preg Test, Ur NEGATIVE  NEGATIVE  URINALYSIS, ROUTINE W REFLEX MICROSCOPIC      Result Value Ref Range   Color, Urine YELLOW  YELLOW   APPearance CLOUDY (*) CLEAR   Specific Gravity, Urine 1.020  1.005 - 1.030   pH 6.5  5.0 - 8.0   Glucose, UA NEGATIVE  NEGATIVE mg/dL   Hgb urine dipstick NEGATIVE  NEGATIVE   Bilirubin Urine NEGATIVE  NEGATIVE   Ketones, ur NEGATIVE  NEGATIVE mg/dL   Protein, ur NEGATIVE  NEGATIVE mg/dL   Urobilinogen, UA 0.2  0.0 - 1.0 mg/dL   Nitrite NEGATIVE  NEGATIVE   Leukocytes, UA SMALL (*) NEGATIVE  LIPASE, BLOOD      Result Value Ref Range   Lipase 29  11 - 59 U/L  URINE MICROSCOPIC-ADD ON      Result Value Ref Range   Squamous Epithelial / LPF MANY (*) RARE   WBC, UA 3-6  <3 WBC/hpf   Bacteria, UA FEW (*) RARE   Dg Abd Acute W/chest  09/21/2013   CLINICAL DATA:  Abdominal pain.  EXAM: ACUTE ABDOMEN SERIES (ABDOMEN 2 VIEW & CHEST 1 VIEW)  COMPARISON:  None.  FINDINGS: There is no evidence of dilated bowel loops or free intraperitoneal air. Colostomy is noted in right upper quadrant. No radiopaque calculi or other significant radiographic abnormality is seen. Heart size and mediastinal contours  are within normal limits. Both lungs are clear.  IMPRESSION: No evidence of bowel obstruction. No acute cardiopulmonary abnormality seen.   Electronically Signed   By: Roque Lias M.D.   On: 09/21/2013 20:03    Labs Review Labs Reviewed  CBC WITH DIFFERENTIAL - Abnormal; Notable for the following:    RDW 15.7 (*)    All other components within normal limits  BASIC METABOLIC PANEL - Abnormal; Notable for the following:    Potassium 3.4 (*)    GFR calc non Af Amer 88 (*)    All other components within normal limits  URINALYSIS, ROUTINE W REFLEX MICROSCOPIC - Abnormal; Notable for the following:    APPearance CLOUDY (*)    Leukocytes, UA SMALL (*)    All other components within normal limits  URINE MICROSCOPIC-ADD ON - Abnormal; Notable for the following:    Squamous Epithelial / LPF MANY (*)    Bacteria, UA FEW (*)    All other components within normal limits  PREGNANCY, URINE  LIPASE, BLOOD    EKG Interpretation None      MDM   Final diagnoses:  Crohn disease, without complications     I personally performed the services described in this documentation, which was scribed in my presence. The recorded information has been reviewed and considered.     Nelia Shi, MD 09/21/13 [phone]

## 2013-10-28 ENCOUNTER — Encounter (HOSPITAL_COMMUNITY): Payer: Self-pay | Admitting: Emergency Medicine

## 2013-10-28 ENCOUNTER — Emergency Department (HOSPITAL_COMMUNITY)
Admission: EM | Admit: 2013-10-28 | Discharge: 2013-10-29 | Disposition: A | Discharge status: Home / Self Care | Payer: Medicare Other | Attending: Emergency Medicine | Admitting: Emergency Medicine

## 2013-10-28 DIAGNOSIS — R21 Rash and other nonspecific skin eruption: Secondary | ICD-10-CM | POA: Diagnosis present

## 2013-10-28 DIAGNOSIS — F17200 Nicotine dependence, unspecified, uncomplicated: Secondary | ICD-10-CM | POA: Insufficient documentation

## 2013-10-28 DIAGNOSIS — R110 Nausea: Secondary | ICD-10-CM | POA: Insufficient documentation

## 2013-10-28 DIAGNOSIS — L0100 Impetigo, unspecified: Secondary | ICD-10-CM | POA: Insufficient documentation

## 2013-10-28 DIAGNOSIS — Z87448 Personal history of other diseases of urinary system: Secondary | ICD-10-CM | POA: Diagnosis not present

## 2013-10-28 DIAGNOSIS — I10 Essential (primary) hypertension: Secondary | ICD-10-CM | POA: Diagnosis not present

## 2013-10-28 DIAGNOSIS — G8929 Other chronic pain: Secondary | ICD-10-CM | POA: Insufficient documentation

## 2013-10-28 DIAGNOSIS — IMO0002 Reserved for concepts with insufficient information to code with codable children: Secondary | ICD-10-CM | POA: Diagnosis not present

## 2013-10-28 DIAGNOSIS — K219 Gastro-esophageal reflux disease without esophagitis: Secondary | ICD-10-CM | POA: Diagnosis not present

## 2013-10-28 DIAGNOSIS — Z79899 Other long term (current) drug therapy: Secondary | ICD-10-CM | POA: Insufficient documentation

## 2013-10-28 DIAGNOSIS — B354 Tinea corporis: Secondary | ICD-10-CM | POA: Insufficient documentation

## 2013-10-28 MED ORDER — CEPHALEXIN 500 MG PO CAPS
500.0000 mg | ORAL_CAPSULE | Freq: Once | ORAL | Status: AC
  Administered 2013-10-29: 500 mg via ORAL
  Filled 2013-10-28: qty 1

## 2013-10-28 MED ORDER — CEPHALEXIN 500 MG PO CAPS: 500.0000 mg | ORAL_CAPSULE | Freq: Two times a day (BID) | ORAL | Status: DC

## 2013-10-28 NOTE — ED Notes (Signed)
Patient complaining of generalized rash x 3 days.

## 2013-10-29 DIAGNOSIS — L0100 Impetigo, unspecified: Secondary | ICD-10-CM | POA: Diagnosis not present

## 2013-10-29 MED ORDER — CLOTRIMAZOLE-BETAMETHASONE 1-0.05 % EX CREA: TOPICAL_CREAM | CUTANEOUS | Status: DC

## 2013-10-29 MED ORDER — ONDANSETRON 8 MG PO TBDP
8.0000 mg | ORAL_TABLET | Freq: Once | ORAL | Status: AC
  Administered 2013-10-29: 8 mg via ORAL
  Filled 2013-10-29: qty 1

## 2013-10-29 MED ORDER — ONDANSETRON HCL 8 MG PO TABS: 8.0000 mg | ORAL_TABLET | Freq: Three times a day (TID) | ORAL | Status: DC | PRN

## 2013-10-29 NOTE — ED Provider Notes (Signed)
CSN: [id number]     Arrival date & time 10/28/13  2017 History   First MD Initiated Contact with Patient 10/28/13 2312     Chief Complaint  Patient presents with  . Rash     (Consider location/radiation/quality/duration/timing/severity/associated sxs/prior Treatment) The history is provided by the patient.   Samantha Page is a 38 y.o. female with a current flare of her crohns disease including cutaneous lesions presenting with a pruritic rash in right groin which was been present for the past 3 days.  There has been no drainage from the site and is nontender.  She has found no alleviators.    Additionally she reports painful skin lesions on her dorsal distal fingers of her left hand, also starting several days ago.  She describes blisters of her cuticles which have popped and is draining yellow discharge. The lesions are tender and not itchy like the rash in her groin.   Her GI doctor is in [place] at [facility].  She was scheduled from a colonoscopy today but it was cancelled due to this rash and open lesions.       Past Medical History  Diagnosis Date  . Back pain, chronic   . Crohn disease   . Hypertension   . Gastroesophageal reflux   . Renal disorder    Past Surgical History  Procedure Laterality Date  . Colectomy    . Tubal ligation    . Tonsillectomy    . Uterine ablation    . Colostomy     Family History  Problem Relation Age of Onset  . Diabetes Mother   . Hypertension Mother    History  Substance Use Topics  . Smoking status: Current Every Day Smoker -- 0.50 packs/day for 16 years    Types: Cigarettes  . Smokeless tobacco: Never Used  . Alcohol Use: No   OB History   Grav Para Term Preterm Abortions TAB SAB Ect Mult Living   5 1  1 4  4   1      Review of Systems  Constitutional: Negative for fever and chills.  Respiratory: Negative for shortness of breath and wheezing.   Gastrointestinal: Positive for nausea.  Skin: Positive for rash.  Neurological:  Negative for numbness.      Allergies  Darvocet; Ibuprofen; Polyethylene glycol; Remicade; Tramadol; Erythromycin; Mesalamine; and Sulfa antibiotics  Home Medications   Prior to Admission medications   Medication Sig Start Date End Date Taking? Authorizing Provider  ALPRAZolam Prudy Feeler) 0.5 MG tablet Take 0.5 mg by mouth at bedtime as needed for sleep.    Historical Provider, MD  balsalazide (COLAZAL) 750 MG capsule Take 2,250 mg by mouth 2 (two) times daily.    Historical Provider, MD  cephALEXin (KEFLEX) 500 MG capsule Take 1 capsule (500 mg total) by mouth 2 (two) times daily. 10/28/13   Burgess Amor, PA-C  clotrimazole-betamethasone (LOTRISONE) cream Apply to rash in right groin 2 times daily for up to 10 days. 10/29/13   Burgess Amor, PA-C  cyclobenzaprine (FLEXERIL) 10 MG tablet Take 10 mg by mouth 3 (three) times daily as needed for muscle spasms.    Historical Provider, MD  dextroamphetamine (DEXEDRINE SPANSULE) 15 MG 24 hr capsule Take 30 mg by mouth daily.    Historical Provider, MD  diphenoxylate-atropine (LOMOTIL) 2.5-0.025 MG per tablet Take 1 tablet by mouth daily as needed for diarrhea or loose stools.  01/28/13   Historical Provider, MD  gabapentin (NEURONTIN) 300 MG capsule Take 300 mg by  mouth 3 (three) times daily. 08/26/13   Historical Provider, MD  lansoprazole (PREVACID) 30 MG capsule Take 30 mg by mouth daily.    Historical Provider, MD  losartan (COZAAR) 25 MG tablet Take 25 mg by mouth daily. 06/05/13   Historical Provider, MD  ondansetron (ZOFRAN) 8 MG tablet Take 1 tablet (8 mg total) by mouth every 8 (eight) hours as needed for nausea or vomiting. 10/29/13   Burgess AmorJulie Abbiegail Landgren, PA-C  oxyCODONE-acetaminophen (PERCOCET) 7.5-325 MG per tablet Take 1 tablet by mouth every 4 (four) hours as needed for pain. 09/21/13   Nelia Shiobert L Beaton, MD  predniSONE (DELTASONE) 10 MG tablet Take 5 tablets (50 mg total) by mouth daily. 09/21/13   Nelia Shiobert L Beaton, MD  promethazine (PHENERGAN) 25 MG tablet Take 1  tablet (25 mg total) by mouth every 6 (six) hours as needed for nausea or vomiting. 08/13/13   Sunnie NielsenBrian Opitz, MD   BP 125/94  Pulse 61  Temp(Src) 99.1 F (37.3 C) (Oral)  Resp 16  Ht 5' (1.524 m)  Wt 194 lb (87.998 kg)  BMI 37.89 kg/m2  SpO2 99%  LMP 09/27/2013 Physical Exam  Constitutional: She appears well-developed and well-nourished. No distress.  HENT:  Head: Normocephalic.  Neck: Neck supple.  Cardiovascular: Normal rate.   Pulmonary/Chest: Effort normal. She has no wheezes.  Musculoskeletal: Normal range of motion. She exhibits no edema.  Skin: Rash noted.  Erythematous macular rash skin folds of pannus of right groin,  Distinct border with several satellite lesions.  Sloughed outer skin along cuticle edges of left index and long fingers with honey crusted drainage.      ED Course  Procedures (including critical care time) Labs Review Labs Reviewed  WOUND CULTURE    Imaging Review No results found.   EKG Interpretation None      MDM   Final diagnoses:  Impetigo  Tinea corporis   Keflex,  Clotrimazole cream for suspected intertrigo/tinea infection.  Advised warm soaks bid to fingers.  F/u with pcp for a recheck this week if not improving.  Also advised f/u with GI which patient has already arranged.      Burgess AmorJulie Milagros Middendorf, PA-C 10/29/13 0110

## 2013-10-29 NOTE — ED Provider Notes (Signed)
Medical screening examination/treatment/procedure(s) were performed by non-physician practitioner and as supervising physician I was immediately available for consultation/collaboration.   EKG Interpretation None        Hanley Seamen, MD 10/29/13 [phone]

## 2013-10-29 NOTE — Discharge Instructions (Signed)
I suspect your finger infection is from a skin infection called impetigo.  Take the entire course of antibiotics prescribed. Do a warm epsom salt soak for 10 minutes several times daily as discussed,  Then dry completely and keep your fingers covered.  Followup with your doctor this week if your symptoms persist or worsen.  You may use the cream prescribed on your abdomen, as I suspect this rash is a fungal or yeast infection of the skin.  Try to keep this area as dry as possible.  Use this cream for up to 10 days,  But get rechecked if this rash is not responding to this medication.

## 2013-10-30 ENCOUNTER — Encounter (HOSPITAL_COMMUNITY): Payer: Self-pay | Admitting: Emergency Medicine

## 2013-10-30 DIAGNOSIS — Z9089 Acquired absence of other organs: Secondary | ICD-10-CM | POA: Insufficient documentation

## 2013-10-30 DIAGNOSIS — Z87448 Personal history of other diseases of urinary system: Secondary | ICD-10-CM | POA: Diagnosis not present

## 2013-10-30 DIAGNOSIS — Z9889 Other specified postprocedural states: Secondary | ICD-10-CM | POA: Insufficient documentation

## 2013-10-30 DIAGNOSIS — G8929 Other chronic pain: Secondary | ICD-10-CM | POA: Diagnosis not present

## 2013-10-30 DIAGNOSIS — K219 Gastro-esophageal reflux disease without esophagitis: Secondary | ICD-10-CM | POA: Insufficient documentation

## 2013-10-30 DIAGNOSIS — I10 Essential (primary) hypertension: Secondary | ICD-10-CM | POA: Insufficient documentation

## 2013-10-30 DIAGNOSIS — Z792 Long term (current) use of antibiotics: Secondary | ICD-10-CM | POA: Diagnosis not present

## 2013-10-30 DIAGNOSIS — IMO0002 Reserved for concepts with insufficient information to code with codable children: Secondary | ICD-10-CM | POA: Diagnosis not present

## 2013-10-30 DIAGNOSIS — F17200 Nicotine dependence, unspecified, uncomplicated: Secondary | ICD-10-CM | POA: Diagnosis not present

## 2013-10-30 DIAGNOSIS — R21 Rash and other nonspecific skin eruption: Secondary | ICD-10-CM | POA: Diagnosis present

## 2013-10-30 DIAGNOSIS — Z79899 Other long term (current) drug therapy: Secondary | ICD-10-CM | POA: Insufficient documentation

## 2013-10-30 DIAGNOSIS — R109 Unspecified abdominal pain: Secondary | ICD-10-CM | POA: Diagnosis not present

## 2013-10-30 DIAGNOSIS — Z9851 Tubal ligation status: Secondary | ICD-10-CM | POA: Insufficient documentation

## 2013-10-30 DIAGNOSIS — F411 Generalized anxiety disorder: Secondary | ICD-10-CM | POA: Insufficient documentation

## 2013-10-30 NOTE — ED Notes (Signed)
Pt c/o rash to body that started today.

## 2013-10-31 ENCOUNTER — Telehealth (HOSPITAL_BASED_OUTPATIENT_CLINIC_OR_DEPARTMENT_OTHER): Payer: Self-pay | Admitting: Emergency Medicine

## 2013-10-31 ENCOUNTER — Emergency Department (HOSPITAL_COMMUNITY)
Admission: EM | Admit: 2013-10-31 | Discharge: 2013-10-31 | Disposition: A | Discharge status: Home / Self Care | Payer: Medicare Other | Attending: Emergency Medicine | Admitting: Emergency Medicine

## 2013-10-31 DIAGNOSIS — G8929 Other chronic pain: Secondary | ICD-10-CM

## 2013-10-31 DIAGNOSIS — R21 Rash and other nonspecific skin eruption: Secondary | ICD-10-CM

## 2013-10-31 DIAGNOSIS — R109 Unspecified abdominal pain: Secondary | ICD-10-CM

## 2013-10-31 MED ORDER — PROMETHAZINE HCL 25 MG PO TABS: 25.0000 mg | ORAL_TABLET | Freq: Four times a day (QID) | ORAL | Status: DC | PRN

## 2013-10-31 MED ORDER — TRIAMCINOLONE ACETONIDE 0.1 % EX CREA: 1.0000 "application " | TOPICAL_CREAM | Freq: Three times a day (TID) | CUTANEOUS | Status: DC

## 2013-10-31 NOTE — ED Notes (Signed)
Pt upset & not wanting to leave w/o blood work being. Done pt informed that EDP has examined pt & reviewed chart & did not order any procedures & advised to follow up w/ her GI & PCP. Pt was given scripts w/ discharge papers.

## 2013-10-31 NOTE — ED Notes (Signed)
MD at bedside. 

## 2013-10-31 NOTE — ED Notes (Signed)
Pt c/o rash across trunk of body. States that she is concerned due to her Chron's disease and ostomy of possible staff infection as pt is being treated for staff infection in her fingertips.

## 2013-10-31 NOTE — Telephone Encounter (Signed)
Post ED Visit - Positive Culture Follow-up  Culture report reviewed by antimicrobial stewardship pharmacist: []  Wes Dulaney, Pharm.D., BCPS []  Celedonio MiyamotoJeremy Frens, Pharm.D., BCPS []  Georgina PillionElizabeth Martin, Pharm.D., BCPS []  [place]h Pham, [place]., BCPS, AAHIVP []  Estella HuskMichelle Turner, Pharm.D., BCPS, AAHIVP [x]  Red ChristiansSamson Lee, Pharm.D. []  Tennis Mustassie Stewart, [place]D.  Positive wound culture Per pharmacist, no treatment needed  and no further patient follow-up is required at this time.  [place], Jenel LucksKylie 10/31/2013, 5:50 PM

## 2013-10-31 NOTE — Discharge Instructions (Signed)
Abdominal (belly) pain can be caused by many things. Your caregiver performed an examination and possibly ordered blood/urine tests and imaging (CT scan, x-rays, ultrasound). Many cases can be observed and treated at home after initial evaluation in the emergency department. Even though you are being discharged home, abdominal pain can be unpredictable. Therefore, you need a repeated exam if your pain does not resolve, returns, or worsens. Most patients with abdominal pain don't have to be admitted to the hospital or have surgery, but serious problems like appendicitis and gallbladder attacks can start out as nonspecific pain. Many abdominal conditions cannot be diagnosed in one visit, so follow-up evaluations are very important. °SEEK IMMEDIATE MEDICAL ATTENTION IF: °The pain does not go away or becomes severe.  °A temperature above 101 develops.  °Repeated vomiting occurs (multiple episodes).  °The pain becomes localized to portions of the abdomen. The right side could possibly be appendicitis. In an adult, the left lower portion of the abdomen could be colitis or diverticulitis.  °Blood is being passed in stools or vomit (bright red or black tarry stools).  °Return also if you develop chest pain, difficulty breathing, dizziness or fainting, or become confused, poorly responsive, or inconsolable (young children). ° °Chronic Pain Discharge Instructions  °Emergency care providers appreciate that many patients coming to us are in severe pain and we wish to address their pain in the safest, most responsible manner.  It is important to recognize however, that the proper treatment of chronic pain differs from that of the pain of injuries and acute illnesses.  Our goal is to provide quality, safe, personalized care and we thank you for giving us the opportunity to serve you. °The use of narcotics and related agents for chronic pain syndromes may lead to additional physical and psychological problems.  Nearly as many  people die from prescription narcotics each year as die from car crashes.  Additionally, this risk is increased if such prescriptions are obtained from a variety of sources.  Therefore, only your primary care physician or a pain management specialist is able to safely treat such syndromes with narcotic medications long-term.   ° °Documentation revealing such prescriptions have been sought from multiple sources may prohibit us from providing a refill or different narcotic medication.  Your name may be checked first through the [place] Controlled Substances Reporting System.  This database is a record of controlled substance medication prescriptions that the patient has received.  This has been established by [place] in an effort to eliminate the dangerous, and often life threatening, practice of obtaining multiple prescriptions from different medical providers.  ° °If you have a chronic pain syndrome (i.e. chronic headaches, recurrent back or neck pain, dental pain, abdominal or pelvis pain without a specific diagnosis, or neuropathic pain such as fibromyalgia) or recurrent visits for the same condition without an acute diagnosis, you may be treated with non-narcotics and other non-addictive medicines.  Allergic reactions or negative side effects that may be reported by a patient to such medications will not typically lead to the use of a narcotic analgesic or other controlled substance as an alternative. °  °Patients managing chronic pain with a personal physician should have provisions in place for breakthrough pain.  If you are in crisis, you should call your physician.  If your physician directs you to the emergency department, please have the doctor call and speak to our attending physician concerning your care. °  °When patients come to the Emergency Department (ED) with acute   medical conditions in which the Emergency Department physician feels appropriate to prescribe narcotic or sedating pain  medication, the physician will prescribe these in very limited quantities.  The amount of these medications will last only until you can see your primary care physician in his/her office.  Any patient who returns to the ED seeking refills should expect only non-narcotic pain medications.   In the event of an acute medical condition exists and the emergency physician feels it is necessary that the patient be given a narcotic or sedating medication -  a responsible adult driver should be present in the room prior to the medication being given by the nurse.   Prescriptions for narcotic or sedating medications that have been lost, stolen or expired will not be refilled in the Emergency Department.    Patients who have chronic pain may receive non-narcotic prescriptions until seen by their primary care physician.  It is every patients personal responsibility to maintain active prescriptions with his or her primary care physician or specialist.  Your primary caregiver may recommend that you see an allergy specialist to determine your cause(s). SEEK MEDICAL ATTENTION IF: You still have considerable itching after taking the medication (prescribed or purchased over the counter) for 24 hours.  A temperature above 100.4 develops.  You have any pain or swelling in your joints.  Your rash last more than 1 week.  You develop new and unexplained symptoms (problems). SEEK IMMEDIATE MEDICAL ATTENTION IF: You have swollen lips or tongue. You have shortness of breath. You have a blistering or bruising rash, sores in your mouth or eyes, or other concerns.

## 2013-10-31 NOTE — ED Provider Notes (Signed)
CSN: [id number]     Arrival date & time 10/30/13  2243 History   First MD Initiated Contact with Patient 10/31/13 0246     Chief Complaint  Patient presents with  . Rash     (Consider location/radiation/quality/duration/timing/severity/associated sxs/prior Treatment) HPI 38 year old female has chronic severe abdominal pain 24/7 with chronic daily vomiting and diarrhea for years waxing and waning over the years and worse for the last several months, she was here within the last week for a rash to her left hand fingertips and her right lower quadrant region and left lower leg which are better and those are not her concerns today, but today she has some itching with a red rash on both shoulders in the distribution of a sunburn without blistering, she also has several scattered small pink bumps on her chest. There is no recent change in her chronic diffuse abdominal severe pain and daily vomiting and loose stools. She has no fever, bloody emesis or stools, CP, cough, SOB, hives, blistering rash, bruising rash, vaginal bleeding, vaginal discharge, dysuria, tongue swelling, lip swelling, drooling, voice change. She requests pain injection since that's what she always gets in the ED.  Past Medical History  Diagnosis Date  . Back pain, chronic   . Crohn disease   . Hypertension   . Gastroesophageal reflux   . Renal disorder    Past Surgical History  Procedure Laterality Date  . Colectomy    . Tubal ligation    . Tonsillectomy    . Uterine ablation    . Colostomy     Family History  Problem Relation Age of Onset  . Diabetes Mother   . Hypertension Mother    History  Substance Use Topics  . Smoking status: Current Every Day Smoker -- 0.50 packs/day for 16 years    Types: Cigarettes  . Smokeless tobacco: Never Used  . Alcohol Use: No   OB History   Grav Para Term Preterm Abortions TAB SAB Ect Mult Living   5 1  1 4  4   1      Review of Systems 10 Systems reviewed and are negative  for acute change except as noted in the HPI.   Allergies  Darvocet; Ibuprofen; Polyethylene glycol; Remicade; Tramadol; Erythromycin; Mesalamine; and Sulfa antibiotics  Home Medications   Prior to Admission medications   Medication Sig Start Date End Date Taking? Authorizing Provider  ALPRAZolam Prudy Feeler) 0.5 MG tablet Take 0.5 mg by mouth at bedtime as needed for sleep.    Historical Provider, MD  balsalazide (COLAZAL) 750 MG capsule Take 2,250 mg by mouth 2 (two) times daily.    Historical Provider, MD  cephALEXin (KEFLEX) 500 MG capsule Take 1 capsule (500 mg total) by mouth 2 (two) times daily. 10/28/13   Burgess Amor, PA-C  clotrimazole-betamethasone (LOTRISONE) cream Apply to rash in right groin 2 times daily for up to 10 days. 10/29/13   Burgess Amor, PA-C  cyclobenzaprine (FLEXERIL) 10 MG tablet Take 10 mg by mouth 3 (three) times daily as needed for muscle spasms.    Historical Provider, MD  dextroamphetamine (DEXEDRINE SPANSULE) 15 MG 24 hr capsule Take 30 mg by mouth daily.    Historical Provider, MD  diphenoxylate-atropine (LOMOTIL) 2.5-0.025 MG per tablet Take 1 tablet by mouth daily as needed for diarrhea or loose stools.  01/28/13   Historical Provider, MD  gabapentin (NEURONTIN) 300 MG capsule Take 300 mg by mouth 3 (three) times daily. 08/26/13   Historical Provider, MD  lansoprazole (PREVACID) 30 MG capsule Take 30 mg by mouth daily.    Historical Provider, MD  losartan (COZAAR) 25 MG tablet Take 25 mg by mouth daily. 06/05/13   Historical Provider, MD  ondansetron (ZOFRAN) 8 MG tablet Take 1 tablet (8 mg total) by mouth every 8 (eight) hours as needed for nausea or vomiting. 10/29/13   Burgess Amor, PA-C  oxyCODONE-acetaminophen (PERCOCET) 7.5-325 MG per tablet Take 1 tablet by mouth every 4 (four) hours as needed for pain. 09/21/13   Nelia Shi, MD  predniSONE (DELTASONE) 10 MG tablet Take 5 tablets (50 mg total) by mouth daily. 09/21/13   Nelia Shi, MD  promethazine (PHENERGAN) 25  MG tablet Take 1 tablet (25 mg total) by mouth every 6 (six) hours as needed for nausea or vomiting. 08/13/13   Sunnie Nielsen, MD  promethazine (PHENERGAN) 25 MG tablet Take 1 tablet (25 mg total) by mouth every 6 (six) hours as needed for nausea or vomiting. 10/31/13   Hurman Horn, MD  triamcinolone cream (KENALOG) 0.1 % Apply 1 application topically 3 (three) times daily. 10/31/13   Hurman Horn, MD   BP 157/114  Pulse 86  Temp(Src) 98.7 F (37.1 C) (Oral)  Resp 19  Ht 5\' 1"  (1.549 m)  Wt 194 lb (87.998 kg)  BMI 36.67 kg/m2  SpO2 100%  LMP 09/27/2013 Physical Exam  Nursing note and vitals reviewed. Constitutional:  Awake, alert, nontoxic appearance.  HENT:  Head: Atraumatic.  Eyes: Right eye exhibits no discharge. Left eye exhibits no discharge.  Neck: Neck supple.  Cardiovascular: Normal rate and regular rhythm.   No murmur heard. Pulmonary/Chest: Effort normal and breath sounds normal. No respiratory distress. She has no wheezes. She has no rales. She exhibits no tenderness.  Abdominal: Soft. Bowel sounds are normal. She exhibits no distension and no mass. There is no tenderness. There is no rebound and no guarding.  Ostomy bag with baseline loose stool output according to the patient without surrounding erythema  Musculoskeletal: She exhibits no tenderness.  Baseline ROM, no obvious new focal weakness.  Neurological:  Mental status and motor strength appears baseline for patient and situation.  Skin: Rash noted.  2 areas of rash on her bilateral posterior shoulders approximately 4-5 cm diameter with bra strap line normal skin underlying consistent with first degree sunburn with no petechiae no purpura no vesicles; she also has several scattered almost imperceptible tiny erythematous papules on her chest without petechiae without purpura without tenderness without evidence of cellulitis without blistering; her left lower leg area of tinea, her left hand fingertips, and her right lower  abdominal wall rash are all improved according to the patient and are not bothering her today compared to her last visit.  Psychiatric:  Anxious angry    ED Course  Procedures (including critical care time) Challenging to develop therapeutic rapport with patient despite multiple efforts at explaining I did not feel Pt was having severe allergic reaction and did not feel opiates were indicated for her chronic abdominal pain that did not appear to be an [facility]. Optional labs and IVF with anti-emetic offered which Pt declined. Pt yelling repeatedly at me and states she will file formal complaint against me. PA student Shanda Bumps witnessed my empathy for patient's frustration of having chronic pain, but Pt did not appear to understand or agree that narcotics not typically recommended for ED treatment of chronic non-cancer stable abdominal pain.  Labs Review Labs Reviewed - No data to display  Imaging Review No results found.   EKG Interpretation None      MDM   Final diagnoses:  Chronic abdominal pain  Rash and nonspecific skin eruption    I doubt any other EMC precluding discharge at this time including, but not necessarily limited to the following:SBI, severe dehydration, anaphylaxis, TEN, SJS, SSSS, peritonitis, complete bowel oobstruction.    Hurman HornJohn M Elah Avellino, MD 11/02/13 [phone]

## 2013-11-01 LAB — WOUND CULTURE: Gram Stain: NONE SEEN

## 2013-11-02 ENCOUNTER — Telehealth (HOSPITAL_BASED_OUTPATIENT_CLINIC_OR_DEPARTMENT_OTHER): Payer: Self-pay | Admitting: Emergency Medicine

## 2013-11-02 NOTE — Telephone Encounter (Signed)
Post ED Visit - Positive Culture Follow-up: Successful Patient Follow-Up  Culture assessed and recommendations reviewed by: []  Wes Dulaney, Pharm.D., BCPS []  Celedonio MiyamotoJeremy Frens, Pharm.D., BCPS []  Georgina PillionElizabeth Martin, Pharm.D., BCPS []  Rural HallMinh Pham, [address] BoulevardPharm[address]D., BCPS, AAHIVP [x]  Estella HuskMichelle Turner, Pharm.D., BCPS, AAHIVP []  Red ChristiansSamson Lee, Pharm.D. []  Cassie Lake ParkStewart, [place]arm[place]D.  Positive wound culture  []  Patient discharged without antimicrobial prescription and treatment is now indicated [x]  Organism is resistant to prescribed ED discharge antimicrobial []  Patient with positive blood cultures  Changes discussed with ED provider: Junius FinnerErin O'Malley PA-C New antibiotic prescription: Doxycycline 100 mg PO BID x 10 days    Karel Turpen 11/02/2013, 10:24 AM

## 2013-11-02 NOTE — Telephone Encounter (Signed)
ID verified. Patient notified of + wound culture and need for new antibiotic RX. infection control instructions provided. RX Doxycycline called to [facility] in [place] [phone].

## 2013-11-02 NOTE — Progress Notes (Signed)
ED Antimicrobial Stewardship Positive Culture Follow Up   Samantha Page is an 38 y.o. female [facility] with a chief complaint of  Chief Complaint  Patient presents with  . Rash    Recent Results (from the past 720 hour(s))  WOUND CULTURE     Status: None   Collection Time    10/29/13 12:15 AM      Result Value Ref Range Status   Specimen Description WOUND FINGER   Final   Special Requests NONE   Final   Gram Stain     Final   Value: NO WBC SEEN     RARE SQUAMOUS EPITHELIAL CELLS PRESENT     FEW GRAM POSITIVE COCCI     IN PAIRS     Performed at [facility]   Culture     Final   Value: FEW METHICILLIN RESISTANT STAPHYLOCOCCUS AUREUS     Note: RIFAMPIN AND GENTAMICIN SHOULD NOT BE USED AS SINGLE DRUGS FOR TREATMENT OF STAPH INFECTIONS. This organism DOES NOT demonstrate inducible Clindamycin resistance in vitro.     MODERATE GROUP A STREP (S.PYOGENES) ISOLATED     Performed at [facility]   Report Status 11/01/2013 FINAL   Final   Organism ID, Bacteria METHICILLIN RESISTANT STAPHYLOCOCCUS AUREUS   Final    [x]  Treated with Keflex, organism resistant to prescribed antimicrobial  New antibiotic prescription: Doxycycline 100mg  PO BID x 10 days.  Patient should stop Keflex.  ED Provider: Junius FinnerErin O'Malley, PA-C   [name], [name] 11/02/2013, 9:56 AM Infectious Diseases Pharmacist Phone# [phone]

## 2013-11-11 ENCOUNTER — Encounter (HOSPITAL_COMMUNITY): Payer: Self-pay | Admitting: Emergency Medicine

## 2013-11-11 ENCOUNTER — Emergency Department (HOSPITAL_COMMUNITY)
Admission: EM | Admit: 2013-11-11 | Discharge: 2013-11-11 | Disposition: A | Discharge status: Home / Self Care | Payer: Medicare Other | Attending: Emergency Medicine | Admitting: Emergency Medicine

## 2013-11-11 DIAGNOSIS — F17200 Nicotine dependence, unspecified, uncomplicated: Secondary | ICD-10-CM | POA: Insufficient documentation

## 2013-11-11 DIAGNOSIS — K219 Gastro-esophageal reflux disease without esophagitis: Secondary | ICD-10-CM | POA: Diagnosis not present

## 2013-11-11 DIAGNOSIS — G8929 Other chronic pain: Secondary | ICD-10-CM | POA: Diagnosis not present

## 2013-11-11 DIAGNOSIS — I10 Essential (primary) hypertension: Secondary | ICD-10-CM | POA: Diagnosis not present

## 2013-11-11 DIAGNOSIS — Z87448 Personal history of other diseases of urinary system: Secondary | ICD-10-CM | POA: Diagnosis not present

## 2013-11-11 DIAGNOSIS — R21 Rash and other nonspecific skin eruption: Secondary | ICD-10-CM | POA: Diagnosis present

## 2013-11-11 DIAGNOSIS — Z79899 Other long term (current) drug therapy: Secondary | ICD-10-CM | POA: Diagnosis not present

## 2013-11-11 DIAGNOSIS — IMO0002 Reserved for concepts with insufficient information to code with codable children: Secondary | ICD-10-CM | POA: Diagnosis not present

## 2013-11-11 DIAGNOSIS — Z792 Long term (current) use of antibiotics: Secondary | ICD-10-CM | POA: Diagnosis not present

## 2013-11-11 MED ORDER — HYDROCORTISONE 1 % EX CREA: TOPICAL_CREAM | CUTANEOUS | Status: DC

## 2013-11-11 MED ORDER — CHLORHEXIDINE GLUCONATE 2 % EX LIQD: 1.0000 "application " | CUTANEOUS | Status: AC

## 2013-11-11 NOTE — ED Notes (Signed)
Rash on chest and back.   Not sure of name of antibiotic.

## 2013-11-11 NOTE — Discharge Instructions (Signed)

## 2013-11-11 NOTE — ED Provider Notes (Signed)
TIME SEEN: 11:03 PM  CHIEF COMPLAINT: Rash  HPI: Patient is a 38 y.o. F with history of Crohn's disease, hypertension who presents emergency department with complaints of rash. She states that she has had intermittent rashes for several weeks. She describes a slightly scaly, pruritic rash to her back. She's also had some small bumps on her breast with white heads and some purulent drainage. She also has a large brown scaly red raised area to her left lower extremity. She states she does have one small papule in her mouth that has resolved. No other lesions in her mouth or on her palms or soles. No fever. No abdominal pain, nausea, vomiting or diarrhea. No tick bite. No sick contacts or recent travel. No new exposures. She states she has been on doxycycline with some improvement of some of the rashes others have not improved.  ROS: See HPI Constitutional: no fever  Eyes: no drainage  ENT: no runny nose   Cardiovascular:  no chest pain  Resp: no SOB  GI: no vomiting GU: no dysuria Integumentary:  rash  Allergy: no hives  Musculoskeletal: no leg swelling  Neurological: no slurred speech ROS otherwise negative  PAST MEDICAL HISTORY/PAST SURGICAL HISTORY:  Past Medical History  Diagnosis Date  . Back pain, chronic   . Crohn disease   . Hypertension   . Gastroesophageal reflux   . Renal disorder     MEDICATIONS:  Prior to Admission medications   Medication Sig Start Date End Date Taking? Authorizing Provider  ALPRAZolam Prudy Feeler(XANAX) 0.5 MG tablet Take 0.5 mg by mouth at bedtime as needed for sleep.   Yes Historical Provider, MD  clotrimazole-betamethasone (LOTRISONE) cream Apply 1 application topically 2 (two) times daily. Apply to rash in right groin 2 times daily for up to 10 days. 10/29/13  Yes Burgess AmorJulie Idol, PA-C  cyclobenzaprine (FLEXERIL) 10 MG tablet Take 10 mg by mouth 3 (three) times daily as needed for muscle spasms.   Yes Historical Provider, MD  dextroamphetamine (DEXEDRINE SPANSULE)  15 MG 24 hr capsule Take 30 mg by mouth daily.   Yes Historical Provider, MD  diphenoxylate-atropine (LOMOTIL) 2.5-0.025 MG per tablet Take 1 tablet by mouth daily as needed for diarrhea or loose stools.  01/28/13  Yes Historical Provider, MD  doxycycline (VIBRAMYCIN) 100 MG capsule Take 100 mg by mouth 2 (two) times daily. 10 day course starting on 11/02/2013 11/02/13  Yes Historical Provider, MD  gabapentin (NEURONTIN) 300 MG capsule Take 300 mg by mouth daily.  08/26/13  Yes Historical Provider, MD  lansoprazole (PREVACID) 30 MG capsule Take 30 mg by mouth daily.   Yes Historical Provider, MD  losartan (COZAAR) 25 MG tablet Take 25 mg by mouth daily. 06/05/13  Yes Historical Provider, MD  ondansetron (ZOFRAN) 8 MG tablet Take 1 tablet (8 mg total) by mouth every 8 (eight) hours as needed for nausea or vomiting. 10/29/13  Yes Burgess AmorJulie Idol, PA-C  promethazine (PHENERGAN) 25 MG tablet Take 1 tablet (25 mg total) by mouth every 6 (six) hours as needed for nausea or vomiting. 10/31/13  Yes Hurman HornJohn M Bednar, MD  triamcinolone cream (KENALOG) 0.1 % Apply 1 application topically 3 (three) times daily. 10/31/13  Yes Hurman HornJohn M Bednar, MD  ciprofloxacin (CIPRO) 500 MG tablet Take 500 mg by mouth 2 (two) times daily. 7 day course starting on 10/31/13 10/31/13   Historical Provider, MD    ALLERGIES:  Allergies  Allergen Reactions  . Darvocet [Propoxyphene N-Acetaminophen]     aggrivates  chrons  . Ibuprofen     chrons's disease  . Polyethylene Glycol Hives and Itching  . Remicade [Infliximab] Swelling    Throat swells up  . Tramadol Nausea And Vomiting    Makes head feel funny  . Erythromycin Rash  . Keflex [Cephalexin] Rash    HIVES  . Mesalamine Rash  . Sulfa Antibiotics Rash    SOCIAL HISTORY:  History  Substance Use Topics  . Smoking status: Current Every Day Smoker -- 0.50 packs/day for 16 years    Types: Cigarettes  . Smokeless tobacco: Never Used  . Alcohol Use: No    FAMILY HISTORY: Family History   Problem Relation Age of Onset  . Diabetes Mother   . Hypertension Mother     EXAM: BP 148/90  Pulse 95  Temp(Src) 98.8 F (37.1 C) (Oral)  Resp 22  Ht 5' (1.524 m)  Wt 194 lb (87.998 kg)  BMI 37.89 kg/m2  SpO2 95%  LMP 11/04/2013 CONSTITUTIONAL: Alert and oriented and responds appropriately to questions. Well-appearing; well-nourished, nontoxic, in no distress HEAD: Normocephalic EYES: Conjunctivae clear, PERRL ENT: normal nose; no rhinorrhea; moist mucous membranes; pharynx without lesions noted NECK: Supple, no meningismus, no LAD  CARD: RRR; S1 and S2 appreciated; no murmurs, no clicks, no rubs, no gallops RESP: Normal chest excursion without splinting or tachypnea; breath sounds clear and equal bilaterally; no wheezes, no rhonchi, no rales,  ABD/GI: Normal bowel sounds; non-distended; soft, non-tender, no rebound, no guarding BACK:  The back appears normal and is non-tender to palpation, there is no CVA tenderness EXT: Normal ROM in all joints; non-tender to palpation; no edema; normal capillary refill; no cyanosis; no joint effusion, no induration or fluctuance   SKIN: Normal color for age and race; warm, 3 x 4 cm raised erythematous circular plaque at the distal medial left calf without drainage, multiple small papules to her bilateral breasts some with small whiteheads without drainage, mildly erythematous, scaly and excoriated papular lesions to her back, no rash or lesions in her mouth, no rash on her palms or soles NEURO: Moves all extremities equally PSYCH: The patient's mood and manner are appropriate. Grooming and personal hygiene are appropriate.  MEDICAL DECISION MAKING: Patient here with multiple rashes. She has one area on her breast that appears to be mild folliculitis. She is on doxycycline and. We'll discharge with prescription for chlorhexidine to use on these areas. She also has areas that appear to be similar to eczema/contact dermatitis. We'll discharge with  prescription for hydrocortisone cream. Discussed with patient given her Crohn's disease, she may be at risk for autoimmune type rashes. Have recommended she followup with a dermatologist for this. I do not think there is any life-threatening illness present. Discussed return precautions and supportive care instructions. She verbalized understanding and is comfortable plan.       Samantha Maw Ward, DO 11/11/13 2330

## 2013-11-30 ENCOUNTER — Encounter (HOSPITAL_COMMUNITY): Payer: Self-pay | Admitting: Emergency Medicine

## 2013-11-30 ENCOUNTER — Emergency Department (HOSPITAL_COMMUNITY)
Admission: EM | Admit: 2013-11-30 | Discharge: 2013-11-30 | Disposition: A | Discharge status: Home / Self Care | Payer: Medicare Other | Attending: Emergency Medicine | Admitting: Emergency Medicine

## 2013-11-30 DIAGNOSIS — L678 Other hair color and hair shaft abnormalities: Secondary | ICD-10-CM | POA: Diagnosis not present

## 2013-11-30 DIAGNOSIS — G8929 Other chronic pain: Secondary | ICD-10-CM | POA: Insufficient documentation

## 2013-11-30 DIAGNOSIS — R197 Diarrhea, unspecified: Secondary | ICD-10-CM | POA: Diagnosis not present

## 2013-11-30 DIAGNOSIS — K219 Gastro-esophageal reflux disease without esophagitis: Secondary | ICD-10-CM | POA: Diagnosis not present

## 2013-11-30 DIAGNOSIS — L738 Other specified follicular disorders: Secondary | ICD-10-CM | POA: Diagnosis not present

## 2013-11-30 DIAGNOSIS — Z79899 Other long term (current) drug therapy: Secondary | ICD-10-CM | POA: Insufficient documentation

## 2013-11-30 DIAGNOSIS — F17200 Nicotine dependence, unspecified, uncomplicated: Secondary | ICD-10-CM | POA: Diagnosis not present

## 2013-11-30 DIAGNOSIS — R21 Rash and other nonspecific skin eruption: Secondary | ICD-10-CM | POA: Insufficient documentation

## 2013-11-30 DIAGNOSIS — Z792 Long term (current) use of antibiotics: Secondary | ICD-10-CM | POA: Diagnosis not present

## 2013-11-30 DIAGNOSIS — L739 Follicular disorder, unspecified: Secondary | ICD-10-CM

## 2013-11-30 DIAGNOSIS — Z760 Encounter for issue of repeat prescription: Secondary | ICD-10-CM

## 2013-11-30 DIAGNOSIS — I10 Essential (primary) hypertension: Secondary | ICD-10-CM | POA: Diagnosis not present

## 2013-11-30 MED ORDER — DIPHENOXYLATE-ATROPINE 2.5-0.025 MG PO TABS: 1.0000 | ORAL_TABLET | Freq: Four times a day (QID) | ORAL | Status: DC | PRN

## 2013-11-30 MED ORDER — CLINDAMYCIN HCL 300 MG PO CAPS: 300.0000 mg | ORAL_CAPSULE | Freq: Three times a day (TID) | ORAL | Status: AC

## 2013-11-30 NOTE — ED Provider Notes (Signed)
CSN: [id number]     Arrival date & time 11/30/13  1236 History   First MD Initiated Contact with Patient 11/30/13 1308     Chief Complaint  Patient presents with  . Rash     (Consider location/radiation/quality/duration/timing/severity/associated sxs/prior Treatment) Patient is a 38 y.o. female presenting with rash.  Rash Associated symptoms: diarrhea   Associated symptoms: no abdominal pain, no fever, no headaches, no joint pain, no nausea, no shortness of breath, no sore throat, not vomiting and not wheezing    Samantha Page is a 38 y.o. female with hx of Crohn's and colostomy who presents to the Emergency Department complaining of persistent rash to legs, arms and abdomen.  She states that she was seen here two weeks ago for same and symptoms are improving, but have not resolved.  She states that she is scheduled for up coming surgery to possibly reverse her colostomy bag and has been taken off her immunosuppressant medications.  She reports having pustules to her lower legs that have "dried up" but now appear as scaly patches and she reports having psoriasis in the past and is concerned that it may be returning.  She has not followed up with a dermatologist.  She denies fever, vomiting, neck pain, or chills.  She also reports having chronic, recurrent diarrhea and has ran out of her Lomotil and requests a refill until see can see her PMD.  She denies abdominal pain, black or bloody stools.     Past Medical History  Diagnosis Date  . Back pain, chronic   . Crohn disease   . Hypertension   . Gastroesophageal reflux   . Renal disorder    Past Surgical History  Procedure Laterality Date  . Colectomy    . Tubal ligation    . Tonsillectomy    . Uterine ablation    . Colostomy     Family History  Problem Relation Age of Onset  . Diabetes Mother   . Hypertension Mother    History  Substance Use Topics  . Smoking status: Current Every Day Smoker -- 0.50 packs/day for 16 years     Types: Cigarettes  . Smokeless tobacco: Never Used  . Alcohol Use: No   OB History   Grav Para Term Preterm Abortions TAB SAB Ect Mult Living   Review of Systems  Constitutional: Negative for fever, chills, activity change and appetite change.  HENT: Negative for facial swelling, sore throat and trouble swallowing.   Respiratory: Negative for chest tightness, shortness of breath and wheezing.   Cardiovascular: Negative for chest pain and palpitations.  Gastrointestinal: Positive for diarrhea. Negative for nausea, vomiting and abdominal pain.  Genitourinary: Negative for dysuria and difficulty urinating.  Musculoskeletal: Negative for arthralgias, back pain, neck pain and neck stiffness.  Skin: Positive for rash. Negative for wound.  Neurological: Negative for dizziness, weakness, numbness and headaches.  All other systems reviewed and are negative.     Allergies  Darvocet; Ibuprofen; Polyethylene glycol; Remicade; Tramadol; Erythromycin; Keflex; Mesalamine; and Sulfa antibiotics  Home Medications   Prior to Admission medications   Medication Sig Start Date End Date Taking? Authorizing Provider  ALPRAZolam Prudy Feeler) 0.5 MG tablet Take 0.5 mg by mouth at bedtime as needed for sleep.    Historical Provider, MD  Chlorhexidine Gluconate 2 % LIQD Apply 1 application topically once a week. 11/11/13   Kristen N Ward, DO  ciprofloxacin (CIPRO)  500 MG tablet Take 500 mg by mouth 2 (two) times daily. 7 day course starting on 10/31/13 10/31/13   Historical Provider, MD  clindamycin (CLEOCIN) 300 MG capsule Take 1 capsule (300 mg total) by mouth 3 (three) times daily. For 7 days 11/30/13   Lucette Kratz L. Sarahann Horrell, PA-C  clotrimazole-betamethasone (LOTRISONE) cream Apply 1 application topically 2 (two) times daily. Apply to rash in right groin 2 times daily for up to 10 days. 10/29/13   Burgess Amor, PA-C  cyclobenzaprine (FLEXERIL) 10 MG tablet Take 10 mg by mouth 3 (three) times daily as  needed for muscle spasms.    Historical Provider, MD  dextroamphetamine (DEXEDRINE SPANSULE) 15 MG 24 hr capsule Take 30 mg by mouth daily.    Historical Provider, MD  diphenoxylate-atropine (LOMOTIL) 2.5-0.025 MG per tablet Take 1 tablet by mouth daily as needed for diarrhea or loose stools.  01/28/13   Historical Provider, MD  diphenoxylate-atropine (LOMOTIL) 2.5-0.025 MG per tablet Take 1 tablet by mouth 4 (four) times daily as needed for diarrhea or loose stools. 11/30/13   Hamed Debella L. Albertus Chiarelli, PA-C  doxycycline (VIBRAMYCIN) 100 MG capsule Take 100 mg by mouth 2 (two) times daily. 10 day course starting on 11/02/2013 11/02/13   Historical Provider, MD  gabapentin (NEURONTIN) 300 MG capsule Take 300 mg by mouth daily.  08/26/13   Historical Provider, MD  hydrocortisone cream 1 % Apply to affected area 2 times daily 11/11/13   Kristen N Ward, DO  lansoprazole (PREVACID) 30 MG capsule Take 30 mg by mouth daily.    Historical Provider, MD  losartan (COZAAR) 25 MG tablet Take 25 mg by mouth daily. 06/05/13   Historical Provider, MD  ondansetron (ZOFRAN) 8 MG tablet Take 1 tablet (8 mg total) by mouth every 8 (eight) hours as needed for nausea or vomiting. 10/29/13   Burgess Amor, PA-C  promethazine (PHENERGAN) 25 MG tablet Take 1 tablet (25 mg total) by mouth every 6 (six) hours as needed for nausea or vomiting. 10/31/13   Hurman Horn, MD  triamcinolone cream (KENALOG) 0.1 % Apply 1 application topically 3 (three) times daily. 10/31/13   Hurman Horn, MD   BP 153/92  Pulse 95  Temp(Src) 98.1 F (36.7 C) (Oral)  Resp 18  Ht 5' (1.524 m)  Wt 194 lb (87.998 kg)  BMI 37.89 kg/m2  SpO2 100%  LMP 11/29/2013  Physical Exam  Nursing note and vitals reviewed. Constitutional: She is oriented to person, place, and time. She appears well-developed and well-nourished. No distress.  HENT:  Head: Normocephalic and atraumatic.  Mouth/Throat: Oropharynx is clear and moist.  Neck: Normal range of motion. Neck supple.   Cardiovascular: Normal rate, regular rhythm, normal heart sounds and intact distal pulses.   No murmur heard. Pulmonary/Chest: Effort normal and breath sounds normal. No respiratory distress.  Abdominal: Soft. She exhibits no mass. There is no tenderness. There is no rebound and no guarding.  Patient has a colostomy present.  Surrounding area is dry without erythema or drainage.    Musculoskeletal: She exhibits no edema and no tenderness.  Lymphadenopathy:    She has no cervical adenopathy.  Neurological: She is alert and oriented to person, place, and time. She exhibits normal muscle tone. Coordination normal.  Skin: Skin is warm. Rash noted. There is erythema.  Erythematous pustules and larger erythematous, scaly plaques noted to the bilateral LE's.  Pitting of the fingernails and multiple smaller, scaly plaques to the abdomen with well defined borders.  ED Course  Procedures (including critical care time) Labs Review Labs Reviewed - No data to display  Imaging Review No results found.   EKG Interpretation None      MDM   Final diagnoses:  Folliculitis  Medication refill    Pt is well appearing.  VSS.  Clinical suspicion for septic illness is low.  Possible MRSA since her child has similar appearing pustules.  Plaques may be related to eczema or recurrent psoriasis since patient is off her immunodepressant meds.  I have advised/stressed the importance of dermatology f/u.  Since patient reports upcoming surgery, I am avoiding use of steroids.  Her agrees to arrange derm f/u in [place] .  She appears stable for d/c and agrees to plan with short course of another antibiotic.     Eather Chaires L. Trisha Mangle, PA-C 11/30/13 2145

## 2013-11-30 NOTE — ED Notes (Signed)
PT c/o rash that started on her lower legs and has spread to abdomen and upper chest x1 week.

## 2013-12-01 NOTE — ED Provider Notes (Signed)
Medical screening examination/treatment/procedure(s) were performed by non-physician practitioner and as supervising physician I was immediately available for consultation/collaboration.    Gerica Koble D Tevita Gomer, MD 12/01/13 0654 

## 2013-12-09 ENCOUNTER — Encounter (HOSPITAL_COMMUNITY): Payer: Self-pay | Admitting: Emergency Medicine

## 2013-12-09 ENCOUNTER — Emergency Department (HOSPITAL_COMMUNITY)
Admission: EM | Admit: 2013-12-09 | Discharge: 2013-12-10 | Disposition: A | Discharge status: Home / Self Care | Payer: Medicare Other | Attending: Emergency Medicine | Admitting: Emergency Medicine

## 2013-12-09 ENCOUNTER — Emergency Department (HOSPITAL_COMMUNITY): Payer: Medicare Other

## 2013-12-09 DIAGNOSIS — F17200 Nicotine dependence, unspecified, uncomplicated: Secondary | ICD-10-CM | POA: Diagnosis not present

## 2013-12-09 DIAGNOSIS — R109 Unspecified abdominal pain: Secondary | ICD-10-CM

## 2013-12-09 DIAGNOSIS — K219 Gastro-esophageal reflux disease without esophagitis: Secondary | ICD-10-CM | POA: Insufficient documentation

## 2013-12-09 DIAGNOSIS — Z792 Long term (current) use of antibiotics: Secondary | ICD-10-CM | POA: Diagnosis not present

## 2013-12-09 DIAGNOSIS — G8929 Other chronic pain: Secondary | ICD-10-CM | POA: Insufficient documentation

## 2013-12-09 DIAGNOSIS — Z79899 Other long term (current) drug therapy: Secondary | ICD-10-CM | POA: Insufficient documentation

## 2013-12-09 DIAGNOSIS — R1110 Vomiting, unspecified: Secondary | ICD-10-CM | POA: Insufficient documentation

## 2013-12-09 DIAGNOSIS — Z87448 Personal history of other diseases of urinary system: Secondary | ICD-10-CM | POA: Insufficient documentation

## 2013-12-09 DIAGNOSIS — Z933 Colostomy status: Secondary | ICD-10-CM | POA: Diagnosis not present

## 2013-12-09 DIAGNOSIS — R1084 Generalized abdominal pain: Secondary | ICD-10-CM | POA: Diagnosis not present

## 2013-12-09 DIAGNOSIS — Z9089 Acquired absence of other organs: Secondary | ICD-10-CM | POA: Diagnosis not present

## 2013-12-09 DIAGNOSIS — R197 Diarrhea, unspecified: Secondary | ICD-10-CM | POA: Insufficient documentation

## 2013-12-09 DIAGNOSIS — I10 Essential (primary) hypertension: Secondary | ICD-10-CM | POA: Insufficient documentation

## 2013-12-09 LAB — LIPASE, BLOOD: Lipase: 22 U/L (ref 11–59)

## 2013-12-09 LAB — URINE MICROSCOPIC-ADD ON

## 2013-12-09 LAB — CBC WITH DIFFERENTIAL/PLATELET
Basophils Absolute: 0 10*3/uL (ref 0.0–0.1)
Basophils Relative: 0 % (ref 0–1)
Eosinophils Absolute: 0.1 10*3/uL (ref 0.0–0.7)
Eosinophils Relative: 1 % (ref 0–5)
HCT: 42.1 % (ref 36.0–46.0)
Hemoglobin: 14 g/dL (ref 12.0–15.0)
Lymphocytes Relative: 22 % (ref 12–46)
Lymphs Abs: 2.7 10*3/uL (ref 0.7–4.0)
MCH: 28 pg (ref 26.0–34.0)
MCHC: 33.3 g/dL (ref 30.0–36.0)
MCV: 84.2 fL (ref 78.0–100.0)
Monocytes Absolute: 0.4 10*3/uL (ref 0.1–1.0)
Monocytes Relative: 3 % (ref 3–12)
Neutro Abs: 9 10*3/uL — ABNORMAL HIGH (ref 1.7–7.7)
Neutrophils Relative %: 74 % (ref 43–77)
Platelets: 342 10*3/uL (ref 150–400)
RBC: 5 MIL/uL (ref 3.87–5.11)
RDW: 16.1 % — ABNORMAL HIGH (ref 11.5–15.5)
WBC: 12.4 10*3/uL — ABNORMAL HIGH (ref 4.0–10.5)

## 2013-12-09 LAB — COMPREHENSIVE METABOLIC PANEL
ALT: 9 U/L (ref 0–35)
AST: 15 U/L (ref 0–37)
Albumin: 3.6 g/dL (ref 3.5–5.2)
Alkaline Phosphatase: 40 U/L (ref 39–117)
Anion gap: 12 (ref 5–15)
BUN: 13 mg/dL (ref 6–23)
CO2: 27 mEq/L (ref 19–32)
Calcium: 9.4 mg/dL (ref 8.4–10.5)
Chloride: 100 mEq/L (ref 96–112)
Creatinine, Ser: 0.84 mg/dL (ref 0.50–1.10)
GFR calc Af Amer: >90 mL/min (ref >90)
GFR calc non Af Amer: 88 mL/min — ABNORMAL LOW (ref >90)
Glucose, Bld: 87 mg/dL (ref 70–99)
Potassium: 3.8 mEq/L (ref 3.7–5.3)
Sodium: 139 mEq/L (ref 137–147)
Total Bilirubin: 0.2 mg/dL — ABNORMAL LOW (ref 0.3–1.2)
Total Protein: 8.2 g/dL (ref 6.0–8.3)

## 2013-12-09 LAB — URINALYSIS, ROUTINE W REFLEX MICROSCOPIC
Bilirubin Urine: NEGATIVE
Glucose, UA: NEGATIVE mg/dL
Leukocytes, UA: NEGATIVE
Nitrite: NEGATIVE
Protein, ur: NEGATIVE mg/dL
Specific Gravity, Urine: 1.025 (ref 1.005–1.030)
Urobilinogen, UA: 0.2 mg/dL (ref 0.0–1.0)
pH: 5.5 (ref 5.0–8.0)

## 2013-12-09 MED ORDER — SODIUM CHLORIDE 0.9 % IV BOLUS (SEPSIS)
2000.0000 mL | Freq: Once | INTRAVENOUS | Status: AC
  Administered 2013-12-09: 1000 mL via INTRAVENOUS

## 2013-12-09 MED ORDER — HYDROMORPHONE HCL PF 1 MG/ML IJ SOLN
0.5000 mg | Freq: Once | INTRAMUSCULAR | Status: AC
  Administered 2013-12-09: 0.5 mg via INTRAVENOUS
  Filled 2013-12-09: qty 1

## 2013-12-09 MED ORDER — ONDANSETRON HCL 4 MG/2ML IJ SOLN
4.0000 mg | Freq: Once | INTRAMUSCULAR | Status: AC
  Administered 2013-12-09: 4 mg via INTRAVENOUS
  Filled 2013-12-09: qty 2

## 2013-12-09 NOTE — ED Notes (Signed)
Pt changed bag to her colostomy with her own supplies. No assistance needed from nurse.

## 2013-12-09 NOTE — ED Notes (Signed)
Vomiting , diarrhea, abd pain

## 2013-12-09 NOTE — ED Provider Notes (Signed)
CSN: [id number]     Arrival date & time 12/09/13  1919 History   First MD Initiated Contact with Patient 12/09/13 2154     This chart was scribed for Hurman Horn, MD by Tonye Royalty, ED Scribe. This patient was seen in room [facility]12/[facility]12 and the patient's care was started at 10:00 PM.   Chief Complaint  Patient presents with  . Emesis   The history is provided by the patient. No language interpreter was used.    HPI Comments: Samantha Page is a 38 y.o. female with history of colostomy and chronic abdominal pain who presents to the Emergency Department complaining of multiple counts of non-bloody vomiting and constant diffuse abdominal pain with onset 1-2 days ago. She states she has been okay at baseline pain for the past few weeks until 1-2 days ago. She reports associated watery stool and weakness. She denies tarry stool or blood in emesis.   Past Medical History  Diagnosis Date  . Back pain, chronic   . Crohn disease   . Hypertension   . Gastroesophageal reflux   . Renal disorder    Past Surgical History  Procedure Laterality Date  . Colectomy    . Tubal ligation    . Tonsillectomy    . Uterine ablation    . Colostomy     Family History  Problem Relation Age of Onset  . Diabetes Mother   . Hypertension Mother    History  Substance Use Topics  . Smoking status: Current Every Day Smoker -- 0.50 packs/day for 16 years    Types: Cigarettes  . Smokeless tobacco: Never Used  . Alcohol Use: No   OB History   Grav Para Term Preterm Abortions TAB SAB Ect Mult Living   Review of Systems 10 Systems reviewed and are negative for acute change except as noted in the HPI.   Allergies  Darvocet; Ibuprofen; Polyethylene glycol; Remicade; Tramadol; Erythromycin; Keflex; Mesalamine; and Sulfa antibiotics  Home Medications   Prior to Admission medications   Medication Sig Start Date End Date Taking? Authorizing Provider  ALPRAZolam Prudy Feeler) 0.5 MG tablet Take  0.5 mg by mouth at bedtime as needed for sleep.   Yes Historical Provider, MD  clindamycin (CLEOCIN) 300 MG capsule Take 1 capsule (300 mg total) by mouth 3 (three) times daily. For 7 days 11/30/13  Yes Tammy L. Triplett, PA-C  cyclobenzaprine (FLEXERIL) 10 MG tablet Take 10 mg by mouth 3 (three) times daily as needed for muscle spasms.   Yes Historical Provider, MD  dextroamphetamine (DEXEDRINE SPANSULE) 15 MG 24 hr capsule Take 30 mg by mouth daily.   Yes Historical Provider, MD  diphenoxylate-atropine (LOMOTIL) 2.5-0.025 MG per tablet Take 1 tablet by mouth daily as needed for diarrhea or loose stools.  01/28/13  Yes Historical Provider, MD  gabapentin (NEURONTIN) 300 MG capsule Take 300 mg by mouth daily.  08/26/13  Yes Historical Provider, MD  hydrocortisone cream 1 % Apply to affected area 2 times daily 11/11/13  Yes Kristen N Ward, DO  lansoprazole (PREVACID) 30 MG capsule Take 30 mg by mouth daily.   Yes Historical Provider, MD  losartan (COZAAR) 25 MG tablet Take 25 mg by mouth daily. 06/05/13  Yes Historical Provider, MD  ondansetron (ZOFRAN) 8 MG tablet Take 1 tablet (8 mg total) by mouth every 8 (eight) hours as needed for nausea or vomiting. 10/29/13  Yes Raynelle Fanning  Idol, PA-C  promethazine (PHENERGAN) 25 MG tablet Take 1 tablet (25 mg total) by mouth every 6 (six) hours as needed for nausea or vomiting. 10/31/13  Yes Hurman Horn, MD  Chlorhexidine Gluconate 2 % LIQD Apply 1 application topically once a week. 11/11/13   Kristen N Ward, DO  loperamide (IMODIUM) 2 MG capsule Take two tabs po initially, then one tab after each loose stool: max 8 tabs in 24 hours 12/10/13   Hurman Horn, MD  ondansetron (ZOFRAN ODT) 8 MG disintegrating tablet  ODT q4 hours prn nausea 12/10/13   Hurman Horn, MD  ondansetron (ZOFRAN ODT) 8 MG disintegrating tablet  ODT q4 hours prn nausea 12/10/13   Hurman Horn, MD   BP 145/97  Pulse 81  Temp(Src) 98.4 F (36.9 C) (Oral)  Resp 18  Ht 5' (1.524 m)  Wt 194 lb  (87.998 kg)  BMI 37.89 kg/m2  SpO2 100%  LMP 11/29/2013 Physical Exam  Nursing note and vitals reviewed. Constitutional:  Awake, alert, nontoxic appearance.  HENT:  Head: Atraumatic.  Eyes: Right eye exhibits no discharge. Left eye exhibits no discharge.  Neck: Neck supple.  Cardiovascular: Normal rate, regular rhythm and normal heart sounds.   No murmur heard. Pulmonary/Chest: Effort normal and breath sounds normal. No respiratory distress. She has no wheezes. She has no rales. She exhibits no tenderness.  Pulse ox normal on room air 100%  Abdominal: Soft. Bowel sounds are normal. There is tenderness (mild to moderate diffuse tenderness). There is no rebound.  Patient currently has empty colostomy bag after emptying it of loose stool in ED  Musculoskeletal: She exhibits no tenderness.  Baseline ROM, no obvious new focal weakness.  Neurological:  Mental status and motor strength appears baseline for patient and situation.  Skin: No rash noted.  Psychiatric: She has a normal mood and affect.    ED Course  Procedures (including critical care time) Patient / Family / Caregiver understand and agree with initial ED impression and plan with expectations set for ED visit. Pt feels improved after observation and/or treatment in ED.Patient / Family / Caregiver informed of clinical course, understand medical decision-making process, and agree with plan. Labs Review Labs Reviewed  CBC WITH DIFFERENTIAL - Abnormal; Notable for the following:    WBC 12.4 (*)    RDW 16.1 (*)    Neutro Abs 9.0 (*)    All other components within normal limits  COMPREHENSIVE METABOLIC PANEL - Abnormal; Notable for the following:    Total Bilirubin 0.2 (*)    GFR calc non Af Amer 88 (*)    All other components within normal limits  URINALYSIS, ROUTINE W REFLEX MICROSCOPIC - Abnormal; Notable for the following:    Hgb urine dipstick TRACE (*)    Ketones, ur TRACE (*)    All other components within normal  limits  URINE MICROSCOPIC-ADD ON - Abnormal; Notable for the following:    Squamous Epithelial / LPF MANY (*)    Bacteria, UA MANY (*)    All other components within normal limits  LIPASE, BLOOD    Imaging Review Dg Abd Acute W/chest  12/09/2013   CLINICAL DATA:  Vomiting. Bloody diarrhea. Mid to left-sided abdominal pain for 2 days. History Crohn's disease. Colostomy November 2013. Hernia.  EXAM: ACUTE ABDOMEN SERIES (ABDOMEN 2 VIEW & CHEST 1 VIEW)  COMPARISON:  09/21/2013  FINDINGS: Heart size is normal. There are no focal consolidations or pleural effusions. No pulmonary edema. No free intraperitoneal  air. Supine and erect views show a nonobstructive bowel gas pattern. Right mid abdominal colostomy is noted. Visualized osseous structures have a normal appearance.  IMPRESSION: 1.  No evidence for acute cardiopulmonary abnormality. 2. Nonobstructive gas pattern.   Electronically Signed   By: Rosalie Gums M.D.   On: 12/09/2013 23:11     EKG Interpretation None     Patient / Family / Caregiver understand and agree with initial ED impression and plan with expectations set for ED visit.   MDM   Final diagnoses:  Vomiting and diarrhea  Abdominal pain, unspecified abdominal location  Chronic abdominal pain   I personally performed the services described in this documentation, which was scribed in my presence. The recorded information has been reviewed and is accurate. I doubt any other EMC precluding discharge at this time including, but not necessarily limited to the following:SBI, peritonitis.     Hurman Horn, MD 12/11/13 [phone]

## 2013-12-10 DIAGNOSIS — R1110 Vomiting, unspecified: Secondary | ICD-10-CM | POA: Diagnosis not present

## 2013-12-10 MED ORDER — ONDANSETRON 8 MG PO TBDP
ORAL_TABLET | ORAL | Status: DC
Start: 2013-12-10 — End: 2013-12-13

## 2013-12-10 MED ORDER — HYDROMORPHONE HCL PF 1 MG/ML IJ SOLN
0.5000 mg | Freq: Once | INTRAMUSCULAR | Status: AC
  Administered 2013-12-10: 0.5 mg via INTRAVENOUS
  Filled 2013-12-10: qty 1

## 2013-12-10 MED ORDER — ONDANSETRON HCL 4 MG/2ML IJ SOLN
4.0000 mg | Freq: Once | INTRAMUSCULAR | Status: AC
  Administered 2013-12-10: 4 mg via INTRAVENOUS
  Filled 2013-12-10: qty 2

## 2013-12-10 MED ORDER — ONDANSETRON 8 MG PO TBDP: ORAL_TABLET | ORAL | Status: AC

## 2013-12-10 MED ORDER — LOPERAMIDE HCL 2 MG PO CAPS: ORAL_CAPSULE | ORAL | Status: AC

## 2013-12-10 NOTE — Discharge Instructions (Signed)

## 2013-12-13 ENCOUNTER — Emergency Department (HOSPITAL_COMMUNITY): Payer: Medicare Other

## 2013-12-13 ENCOUNTER — Encounter (HOSPITAL_COMMUNITY): Payer: Self-pay | Admitting: Emergency Medicine

## 2013-12-13 ENCOUNTER — Emergency Department (HOSPITAL_COMMUNITY)
Admission: EM | Admit: 2013-12-13 | Discharge: 2013-12-14 | Disposition: A | Discharge status: Home / Self Care | Payer: Medicare Other | Attending: Emergency Medicine | Admitting: Emergency Medicine

## 2013-12-13 DIAGNOSIS — R519 Headache, unspecified: Secondary | ICD-10-CM

## 2013-12-13 DIAGNOSIS — M545 Low back pain, unspecified: Secondary | ICD-10-CM | POA: Diagnosis not present

## 2013-12-13 DIAGNOSIS — I10 Essential (primary) hypertension: Secondary | ICD-10-CM | POA: Insufficient documentation

## 2013-12-13 DIAGNOSIS — R52 Pain, unspecified: Secondary | ICD-10-CM | POA: Diagnosis not present

## 2013-12-13 DIAGNOSIS — R05 Cough: Secondary | ICD-10-CM | POA: Diagnosis not present

## 2013-12-13 DIAGNOSIS — R071 Chest pain on breathing: Secondary | ICD-10-CM | POA: Insufficient documentation

## 2013-12-13 DIAGNOSIS — R059 Cough, unspecified: Secondary | ICD-10-CM | POA: Diagnosis not present

## 2013-12-13 DIAGNOSIS — K219 Gastro-esophageal reflux disease without esophagitis: Secondary | ICD-10-CM | POA: Insufficient documentation

## 2013-12-13 DIAGNOSIS — M549 Dorsalgia, unspecified: Secondary | ICD-10-CM | POA: Insufficient documentation

## 2013-12-13 DIAGNOSIS — R079 Chest pain, unspecified: Secondary | ICD-10-CM | POA: Insufficient documentation

## 2013-12-13 DIAGNOSIS — Z79899 Other long term (current) drug therapy: Secondary | ICD-10-CM | POA: Diagnosis not present

## 2013-12-13 DIAGNOSIS — Z3202 Encounter for pregnancy test, result negative: Secondary | ICD-10-CM | POA: Insufficient documentation

## 2013-12-13 DIAGNOSIS — R51 Headache: Secondary | ICD-10-CM | POA: Diagnosis not present

## 2013-12-13 DIAGNOSIS — F17200 Nicotine dependence, unspecified, uncomplicated: Secondary | ICD-10-CM | POA: Diagnosis not present

## 2013-12-13 DIAGNOSIS — G8929 Other chronic pain: Secondary | ICD-10-CM | POA: Diagnosis not present

## 2013-12-13 DIAGNOSIS — Z87448 Personal history of other diseases of urinary system: Secondary | ICD-10-CM | POA: Insufficient documentation

## 2013-12-13 DIAGNOSIS — Z792 Long term (current) use of antibiotics: Secondary | ICD-10-CM | POA: Insufficient documentation

## 2013-12-13 LAB — URINALYSIS, ROUTINE W REFLEX MICROSCOPIC
Bilirubin Urine: NEGATIVE
Glucose, UA: NEGATIVE mg/dL
Ketones, ur: NEGATIVE mg/dL
Leukocytes, UA: NEGATIVE
Nitrite: NEGATIVE
Protein, ur: NEGATIVE mg/dL
Specific Gravity, Urine: 1.015 (ref 1.005–1.030)
Urobilinogen, UA: 0.2 mg/dL (ref 0.0–1.0)
pH: 6.5 (ref 5.0–8.0)

## 2013-12-13 LAB — URINE MICROSCOPIC-ADD ON

## 2013-12-13 LAB — CBC WITH DIFFERENTIAL/PLATELET
Basophils Absolute: 0 10*3/uL (ref 0.0–0.1)
Basophils Relative: 0 % (ref 0–1)
Eosinophils Absolute: 0.2 10*3/uL (ref 0.0–0.7)
Eosinophils Relative: 2 % (ref 0–5)
HCT: 40.6 % (ref 36.0–46.0)
Hemoglobin: 13.5 g/dL (ref 12.0–15.0)
Lymphocytes Relative: 22 % (ref 12–46)
Lymphs Abs: 2.1 10*3/uL (ref 0.7–4.0)
MCH: 27.2 pg (ref 26.0–34.0)
MCHC: 33.3 g/dL (ref 30.0–36.0)
MCV: 81.7 fL (ref 78.0–100.0)
Monocytes Absolute: 0.7 10*3/uL (ref 0.1–1.0)
Monocytes Relative: 7 % (ref 3–12)
Neutro Abs: 6.8 10*3/uL (ref 1.7–7.7)
Neutrophils Relative %: 69 % (ref 43–77)
Platelets: 321 10*3/uL (ref 150–400)
RBC: 4.97 MIL/uL (ref 3.87–5.11)
RDW: 16.2 % — ABNORMAL HIGH (ref 11.5–15.5)
WBC: 9.8 10*3/uL (ref 4.0–10.5)

## 2013-12-13 LAB — POC URINE PREG, ED: Preg Test, Ur: NEGATIVE

## 2013-12-13 MED ORDER — OXYCODONE-ACETAMINOPHEN 5-325 MG PO TABS
1.0000 | ORAL_TABLET | Freq: Once | ORAL | Status: AC
  Administered 2013-12-13: 1 via ORAL
  Filled 2013-12-13: qty 1

## 2013-12-13 MED ORDER — DEXAMETHASONE SODIUM PHOSPHATE 10 MG/ML IJ SOLN
10.0000 mg | Freq: Once | INTRAMUSCULAR | Status: AC
  Administered 2013-12-13: 10 mg via INTRAMUSCULAR
  Filled 2013-12-13: qty 1

## 2013-12-13 NOTE — ED Notes (Signed)
Patient with multiple complaints. Reports lower back pain radiating into both legs and knees, headache, and burning chest pain that is worse with deep breathing. Patient reports all symptoms have been occuring x 2 days.

## 2013-12-14 DIAGNOSIS — M545 Low back pain, unspecified: Secondary | ICD-10-CM | POA: Diagnosis not present

## 2013-12-14 LAB — BASIC METABOLIC PANEL
Anion gap: 13 (ref 5–15)
BUN: 11 mg/dL (ref 6–23)
CO2: 25 mEq/L (ref 19–32)
Calcium: 9 mg/dL (ref 8.4–10.5)
Chloride: 99 mEq/L (ref 96–112)
Creatinine, Ser: 0.8 mg/dL (ref 0.50–1.10)
GFR calc Af Amer: >90 mL/min (ref >90)
GFR calc non Af Amer: >90 mL/min (ref >90)
Glucose, Bld: 101 mg/dL — ABNORMAL HIGH (ref 70–99)
Potassium: 3.2 mEq/L — ABNORMAL LOW (ref 3.7–5.3)
Sodium: 137 mEq/L (ref 137–147)

## 2013-12-14 LAB — TROPONIN I: Troponin I: <0.3 ng/mL (ref <0.30)

## 2013-12-14 MED ORDER — POTASSIUM CHLORIDE CRYS ER 20 MEQ PO TBCR
40.0000 meq | EXTENDED_RELEASE_TABLET | Freq: Once | ORAL | Status: AC
  Administered 2013-12-14: 40 meq via ORAL
  Filled 2013-12-14: qty 2

## 2013-12-14 MED ORDER — OXYCODONE-ACETAMINOPHEN 5-325 MG PO TABS
1.0000 | ORAL_TABLET | Freq: Once | ORAL | Status: AC
  Administered 2013-12-14: 1 via ORAL
  Filled 2013-12-14: qty 1

## 2013-12-14 MED ORDER — OXYCODONE-ACETAMINOPHEN 5-325 MG PO TABS: 1.0000 | ORAL_TABLET | ORAL | Status: AC | PRN

## 2013-12-14 NOTE — ED Provider Notes (Signed)
CSN: [id number]     Arrival date & time 12/13/13  2121 History   First MD Initiated Contact with Patient 12/13/13 2214     Chief Complaint  Patient presents with  . Back Pain     (Consider location/radiation/quality/duration/timing/severity/associated sxs/prior Treatment) The history is provided by the patient.   Samantha Page is a 38 y.o. female with a history significant for Crohn's disease and chronic back pain presenting with multiple complaints.  She describes multiple complaints including acute on chronic low back pain radiating into her lateral legs to her knees since she fell while "wrestling" with her brother yesterday.  Additionally has a had a 2 day history of generalized headache and nonproductive cough which worsens a persistent burning left sided chest pain.  She denies shortness of breath, palpitations, fevers or chills.  Additionally she denies abdominal pain, nausea or vomiting, diarrhea and dysuria.  She is not dizzy, nor does she have visual changes, neck pain or stiffness.  She denies head or neck injury during her wrestling match yesterday.  Her left sided chest pain is improved with splinting.  She has taken tylenol without relief of symptoms.  Past Medical History  Diagnosis Date  . Back pain, chronic   . Crohn disease   . Hypertension   . Gastroesophageal reflux   . Renal disorder    Past Surgical History  Procedure Laterality Date  . Colectomy    . Tubal ligation    . Tonsillectomy    . Uterine ablation    . Colostomy     Family History  Problem Relation Age of Onset  . Diabetes Mother   . Hypertension Mother    History  Substance Use Topics  . Smoking status: Current Every Day Smoker -- 0.50 packs/day for 16 years    Types: Cigarettes  . Smokeless tobacco: Never Used  . Alcohol Use: No   OB History   Grav Para Term Preterm Abortions TAB SAB Ect Mult Living   Review of Systems  Constitutional: Negative for fever and chills.   HENT: Negative for ear pain, hearing loss, rhinorrhea, sinus pressure and sore throat.   Eyes: Negative for visual disturbance.  Respiratory: Positive for cough. Negative for shortness of breath.   Cardiovascular: Positive for chest pain. Negative for palpitations and leg swelling.  Gastrointestinal: Negative for nausea, vomiting, abdominal pain and diarrhea.  Musculoskeletal: Positive for back pain. Negative for neck pain and neck stiffness.  Skin: Negative for color change and rash.  Neurological: Positive for headaches. Negative for dizziness, weakness, light-headedness and numbness.      Allergies  Darvocet; Ibuprofen; Polyethylene glycol; Remicade; Tramadol; Erythromycin; Keflex; Mesalamine; and Sulfa antibiotics  Home Medications   Prior to Admission medications   Medication Sig Start Date End Date Taking? Authorizing Provider  ALPRAZolam Prudy Feeler) 0.5 MG tablet Take 0.5 mg by mouth at bedtime as needed for sleep.   Yes Historical Provider, MD  cyclobenzaprine (FLEXERIL) 10 MG tablet Take 10 mg by mouth 3 (three) times daily as needed for muscle spasms.   Yes Historical Provider, MD  dextroamphetamine (DEXEDRINE SPANSULE) 15 MG 24 hr capsule Take 30 mg by mouth daily.   Yes Historical Provider, MD  diphenoxylate-atropine (LOMOTIL) 2.5-0.025 MG per tablet Take 1 tablet by mouth daily as needed for diarrhea or loose stools.  01/28/13  Yes Historical Provider, MD  fentaNYL (DURAGESIC - DOSED MCG/HR) 25 MCG/HR patch Place  25 mcg onto the skin every 3 (three) days.  12/03/13  Yes Historical Provider, MD  lansoprazole (PREVACID) 30 MG capsule Take 30 mg by mouth daily.   Yes Historical Provider, MD  loperamide (IMODIUM) 2 MG capsule Take two tabs po initially, then one tab after each loose stool: max 8 tabs in 24 hours 12/10/13  Yes Hurman Horn, MD  losartan (COZAAR) 25 MG tablet Take 25 mg by mouth daily. 06/05/13  Yes Historical Provider, MD  ondansetron (ZOFRAN ODT) 8 MG disintegrating  tablet 8mg  ODT q4 hours prn nausea 12/10/13  Yes Hurman Horn, MD  Chlorhexidine Gluconate 2 % LIQD Apply 1 application topically once a week. 11/11/13   Kristen N Ward, DO  clindamycin (CLEOCIN) 300 MG capsule Take 1 capsule (300 mg total) by mouth 3 (three) times daily. For 7 days 11/30/13   Tammy L. Triplett, PA-C  oxyCODONE-acetaminophen (PERCOCET/ROXICET) 5-325 MG per tablet Take 1 tablet by mouth every 4 (four) hours as needed. 12/14/13   Burgess Amor, PA-C   BP 142/95  Pulse 86  Temp(Src) 98.6 F (37 C) (Oral)  Resp 18  Ht 5' (1.524 m)  Wt 184 lb (83.462 kg)  BMI 35.94 kg/m2  SpO2 98%  LMP 11/29/2013 Physical Exam  Nursing note and vitals reviewed. Constitutional: She is oriented to person, place, and time. She appears well-developed and well-nourished.  HENT:  Head: Normocephalic and atraumatic.  Mouth/Throat: Oropharynx is clear and moist.  Eyes: Conjunctivae and EOM are normal. Pupils are equal, round, and reactive to light.  Neck: Normal range of motion. Neck supple.  Cardiovascular: Normal rate, normal heart sounds and intact distal pulses.   Pedal pulses normal.  Pulmonary/Chest: Effort normal. No respiratory distress. She exhibits tenderness.  Reproducible chest wall pain left upper chest wall. No crepitus, no rash, lungs CTAB.  Abdominal: Soft. Bowel sounds are normal. She exhibits no distension and no mass. There is no tenderness.  Musculoskeletal: Normal range of motion. She exhibits no edema.       Lumbar back: She exhibits bony tenderness. She exhibits no swelling, no edema and no spasm.  Lymphadenopathy:    She has no cervical adenopathy.  Neurological: She is alert and oriented to person, place, and time. She has normal strength. She displays no atrophy and no tremor. No sensory deficit. Gait normal. GCS eye subscore is 4. GCS verbal subscore is 5. GCS motor subscore is 6.  Reflex Scores:      Patellar reflexes are 2+ on the right side and 2+ on the left side.       Achilles reflexes are 2+ on the right side and 2+ on the left side. Normal heel-shin, Cranial nerves III-XII intact.  No pronator drift.  Skin: Skin is warm and dry. No rash noted.  Psychiatric: She has a normal mood and affect. Her speech is normal and behavior is normal. Thought content normal. Cognition and memory are normal.    ED Course  Procedures (including critical care time) Labs Review Labs Reviewed  CBC WITH DIFFERENTIAL - Abnormal; Notable for the following:    RDW 16.2 (*)    All other components within normal limits  BASIC METABOLIC PANEL - Abnormal; Notable for the following:    Potassium 3.2 (*)    Glucose, Bld 101 (*)    All other components within normal limits  URINALYSIS, ROUTINE W REFLEX MICROSCOPIC - Abnormal; Notable for the following:    Hgb urine dipstick TRACE (*)    All  other components within normal limits  URINE MICROSCOPIC-ADD ON - Abnormal; Notable for the following:    Squamous Epithelial / LPF MANY (*)    Bacteria, UA FEW (*)    Crystals CA OXALATE CRYSTALS (*)    All other components within normal limits  TROPONIN I  POC URINE PREG, ED    Imaging Review Dg Chest 2 View  12/14/2013   CLINICAL DATA:  Chest pain, dizziness and weakness.  EXAM: CHEST  2 VIEW  COMPARISON:  Chest radiograph performed 12/09/2013  FINDINGS: The lungs are well-aerated and clear. There is no evidence of focal opacification, pleural effusion or pneumothorax.  The heart is normal in size; the mediastinal contour is within normal limits. No acute osseous abnormalities are seen.  IMPRESSION: No acute cardiopulmonary process seen.   Electronically Signed   By: Roanna Raider M.D.   On: 12/14/2013 01:07   Dg Lumbar Spine Complete  12/14/2013   CLINICAL DATA:  Lower back pain.  EXAM: LUMBAR SPINE - COMPLETE 4+ VIEW  COMPARISON:  CT of the abdomen and pelvis performed 08/13/2013, and abdominal radiograph performed 12/09/2013  FINDINGS: There is no evidence of fracture or subluxation.  Vertebral bodies demonstrate normal height and alignment. Intervertebral disc spaces are preserved. The visualized neural foramina are grossly unremarkable in appearance.  The visualized bowel gas pattern is unremarkable in appearance; air and stool are noted within the colon. The sacroiliac joints are within normal limits.  IMPRESSION: No evidence of fracture or subluxation along the lumbar spine.   Electronically Signed   By: Roanna Raider M.D.   On: 12/14/2013 01:08     EKG Interpretation   Date/Time:  Friday December 13 2013 22:12:54 EDT Ventricular Rate:  85 PR Interval:  149 QRS Duration: 86 QT Interval:  357 QTC Calculation: 424 R Axis:   19 Text Interpretation:  Sinus rhythm Borderline repolarization abnormality  Confirmed by DELOS  MD, DOUGLAS ([id number]) on 12/14/2013 1:27:48 AM      MDM   Final diagnoses:  Chronic back pain  Acute nonintractable headache, unspecified headache type  Chest pain, unspecified chest pain type    Pt with multiple complaints tonight, nonfocal exam and no source of sx based on labs/imaging tonight.  Pt is well known to this ed and has chronic pain issues.  No emergent findings on work up [employer].  She was prescribed oxycodone, advised to start her flexeril back , heat to lower back,  Rest, increased fluid intake,  F/u with pcp .  Also given cardiology referral given chest pain.  Troponin negative, chest pain is reproducible, suspect chest wall pain, but patient does have risk factors for coronary syndrome given htn and smoking hx.      Burgess Amor, PA-C 12/14/13 [phone]

## 2013-12-14 NOTE — Discharge Instructions (Signed)
Chronic Back Pain  When back pain lasts longer than 3 months, it is called chronic back pain.People with chronic back pain often go through certain periods that are more intense (flare-ups).  CAUSES Chronic back pain can be caused by wear and tear (degeneration) on different structures in your back. These structures include:  The bones of your spine (vertebrae) and the joints surrounding your spinal cord and nerve roots (facets).  The strong, fibrous tissues that connect your vertebrae (ligaments). Degeneration of these structures may result in pressure on your nerves. This can lead to constant pain. HOME CARE INSTRUCTIONS  Avoid bending, heavy lifting, prolonged sitting, and activities which make the problem worse.  Take brief periods of rest throughout the day to reduce your pain. Lying down or standing usually is better than sitting while you are resting.  Take over-the-counter or prescription medicines only as directed by your caregiver. SEEK IMMEDIATE MEDICAL CARE IF:   You have weakness or numbness in one of your legs or feet.  You have trouble controlling your bladder or bowels.  You have nausea, vomiting, abdominal pain, shortness of breath, or fainting. Document Released: 04/21/2004 Document Revised: 06/06/2011 Document Reviewed: 02/26/2011 [facility] Patient Information 2015 [place], [place]. This information is not intended to replace advice given to you by your health care provider. Make sure you discuss any questions you have with your health care provider.  General Headache Without Cause A headache is pain or discomfort felt around the head or neck area. The specific cause of a headache may not be found. There are many causes and types of headaches. A few common ones are:  Tension headaches.  Migraine headaches.  Cluster headaches.  Chronic daily headaches. HOME CARE INSTRUCTIONS   Keep all follow-up appointments with your caregiver or any specialist  referral.  Only take over-the-counter or prescription medicines for pain or discomfort as directed by your caregiver.  Lie down in a dark, quiet room when you have a headache.  Keep a headache journal to find out what may trigger your migraine headaches. For example, write down:  What you eat and drink.  How much sleep you get.  Any change to your diet or medicines.  Try massage or other relaxation techniques.  Put ice packs or heat on the head and neck. Use these 3 to 4 times per day for 15 to 20 minutes each time, or as needed.  Limit stress.  Sit up straight, and do not tense your muscles.  Quit smoking if you smoke.  Limit alcohol use.  Decrease the amount of caffeine you drink, or stop drinking caffeine.  Eat and sleep on a regular schedule.  Get 7 to 9 hours of sleep, or as recommended by your caregiver.  Keep lights dim if bright lights bother you and make your headaches worse. SEEK MEDICAL CARE IF:   You have problems with the medicines you were prescribed.  Your medicines are not working.  You have a change from the usual headache.  You have nausea or vomiting. SEEK IMMEDIATE MEDICAL CARE IF:   Your headache becomes severe.  You have a fever.  You have a stiff neck.  You have loss of vision.  You have muscular weakness or loss of muscle control.  You start losing your balance or have trouble walking.  You feel faint or pass out.  You have severe symptoms that are different from your first symptoms. MAKE SURE YOU:   Understand these instructions.  Will watch your condition.  Will get help right away if you are not doing well or get worse. Document Released: 03/14/2005 Document Revised: 06/06/2011 Document Reviewed: 03/30/2011 [facility] Patient Information 2015 [place], [place]. This information is not intended to replace advice given to you by your health care provider. Make sure you discuss any questions you have with your health care  provider.   You may take the oxycodone prescribed for pain relief.  This will make you drowsy - do not drive within 4 hours of taking this medication. You will also benefit by taking your flexeril.  Use a heating pad applied to your lower back 20 minutes 3 times daily.  Rest, drink plenty of fluids.  Get rechecked by your doctor if you have any worsened or persistent symptoms. Your lab tests and xrays normal tonight (except your potassium was a little low and was replenished), giving no indication for the source of your symptoms.

## 2013-12-14 NOTE — ED Provider Notes (Signed)
Medical screening examination/treatment/procedure(s) were performed by non-physician practitioner and as supervising physician I was immediately available for consultation/collaboration.   EKG Interpretation   Date/Time:  Friday December 13 2013 22:12:54 EDT Ventricular Rate:  85 PR Interval:  149 QRS Duration: 86 QT Interval:  357 QTC Calculation: 424 R Axis:   19 Text Interpretation:  Sinus rhythm Borderline repolarization abnormality  Confirmed by Malva Cogan  MD, Gloris Shiroma ([id number]) on 12/14/2013 1:27:48 AM       Geoffery Lyons, MD 12/14/13 1275

## 2014-01-27 ENCOUNTER — Encounter (HOSPITAL_COMMUNITY): Payer: Self-pay | Admitting: Emergency Medicine

## 2015-05-23 IMAGING — CT CT ABD-PELV W/O CM
2 of 3 series · 17 of 46 positions shown, 19 images · non-contrast
Comparison: CT ABD - PELV W/ CM dated 05/03/2013

CLINICAL DATA: Intermittent right flank pain and dysuria since
[REDACTED]. White discharge with urination. Chronic back pain. Crohn's
disease.

EXAM:
CT ABDOMEN AND PELVIS WITHOUT CONTRAST
TECHNIQUE: Multidetector CT imaging of the abdomen and pelvis was performed
following the standard protocol without intravenous contrast.

[Series 2: standard/full over (age)lbs 5.0 · axial · 0.77mm/px · z∈[+468,+878]mm · 14 of 96 slices shown, 16 images]
[im 7/96  soft-tissue]
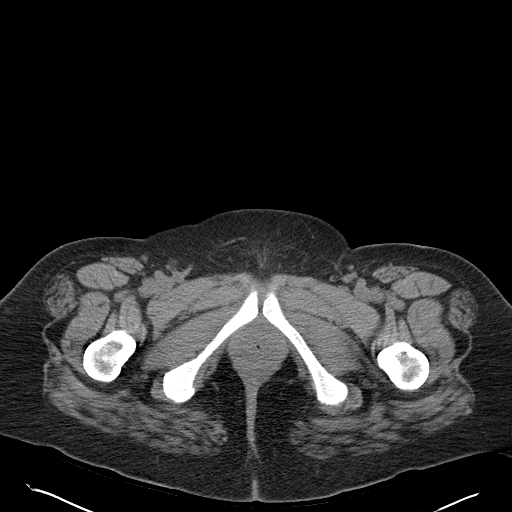
[im 7/96  bone]
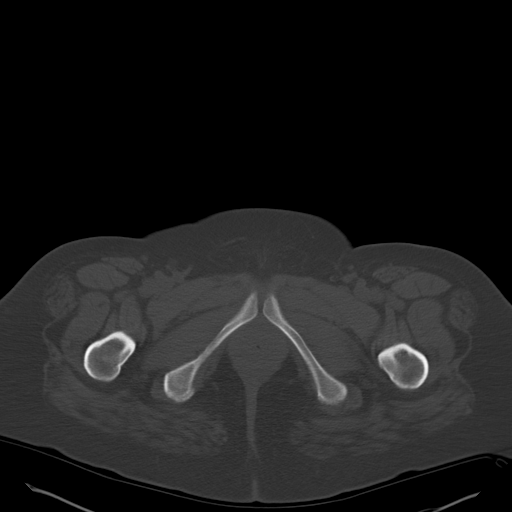
[im 13/96  soft-tissue]
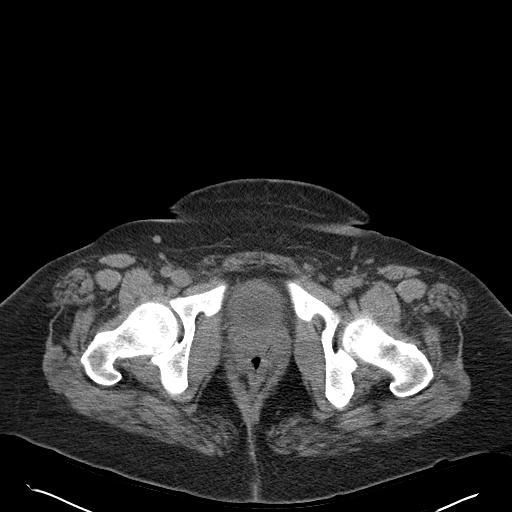
[im 19/96  soft-tissue]
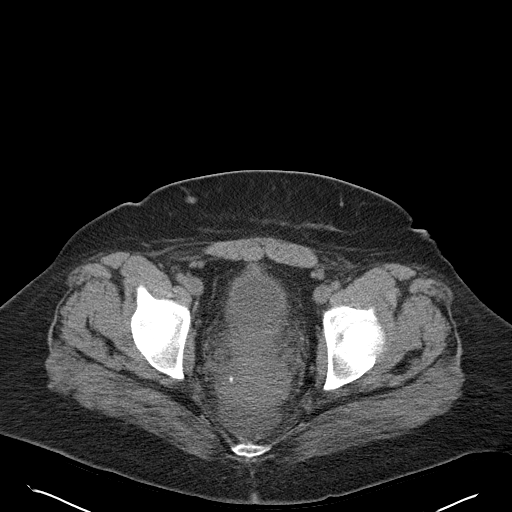
[im 25/96  soft-tissue]
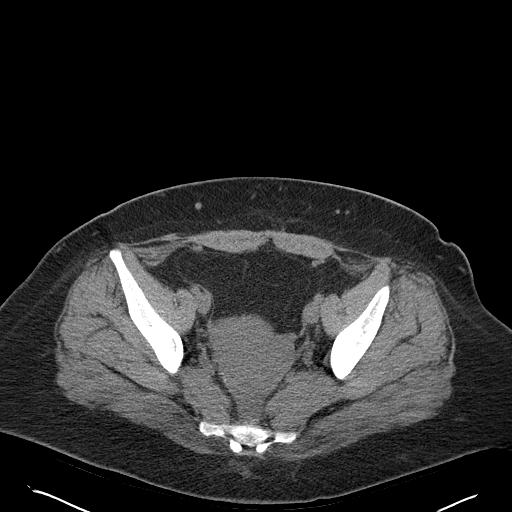
[im 31/96  soft-tissue]
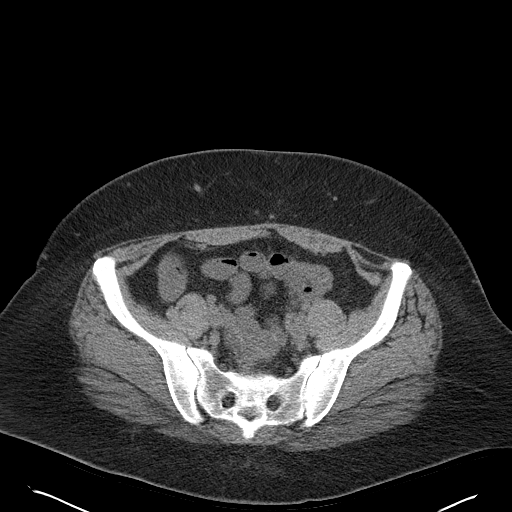
[im 37/96  soft-tissue]
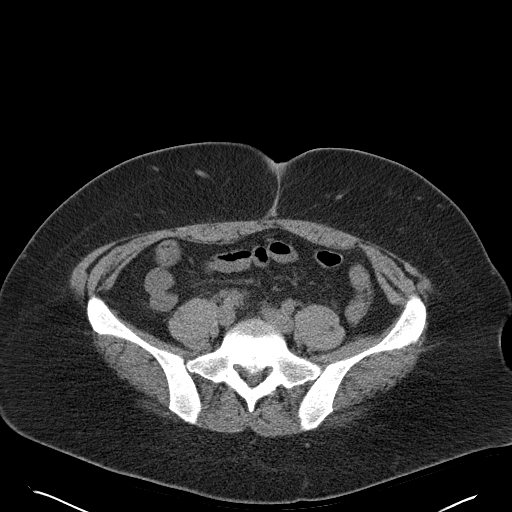
[im 43/96  soft-tissue]
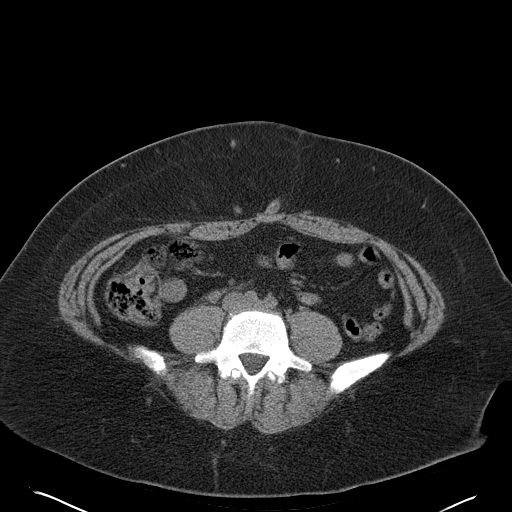
[im 53/96  soft-tissue]
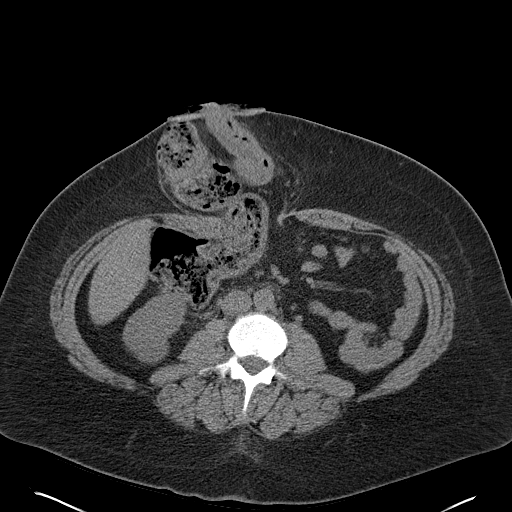
[im 59/96  soft-tissue]
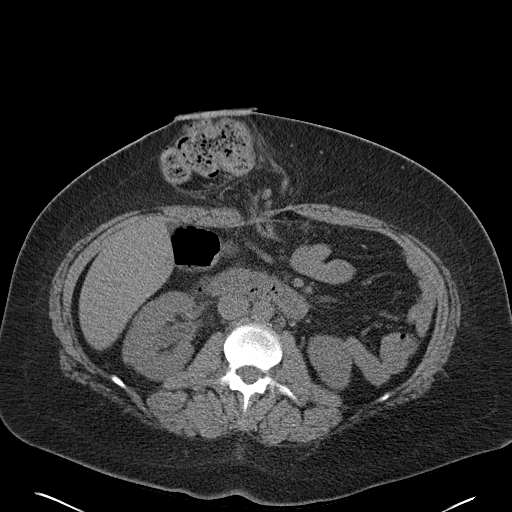
[im 59/96  bone]
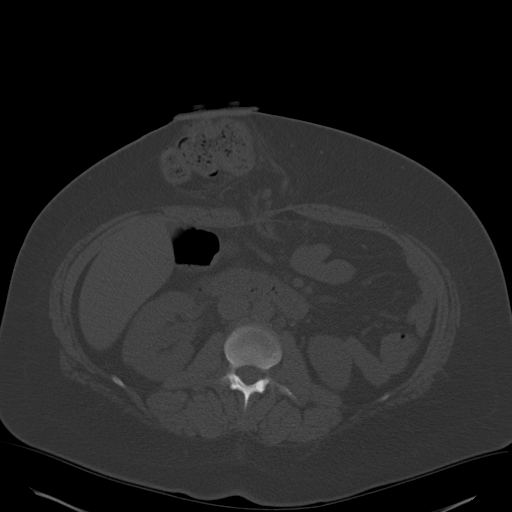
[im 65/96  soft-tissue]
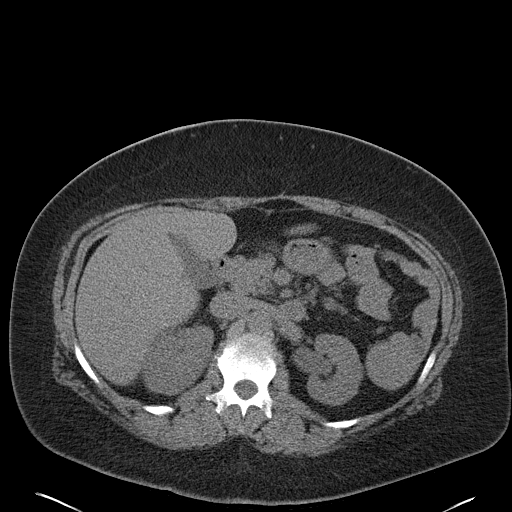
[im 71/96  soft-tissue]
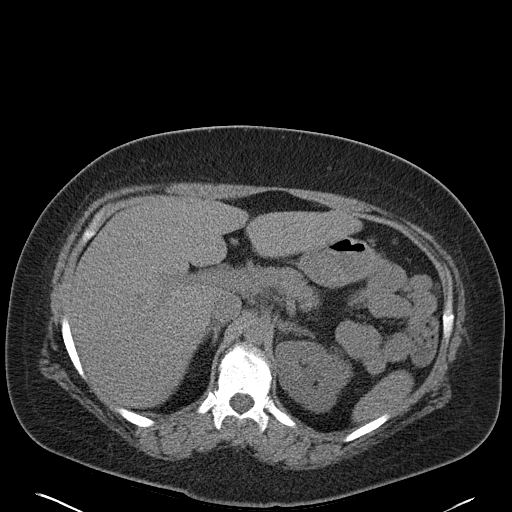
[im 77/96  soft-tissue]
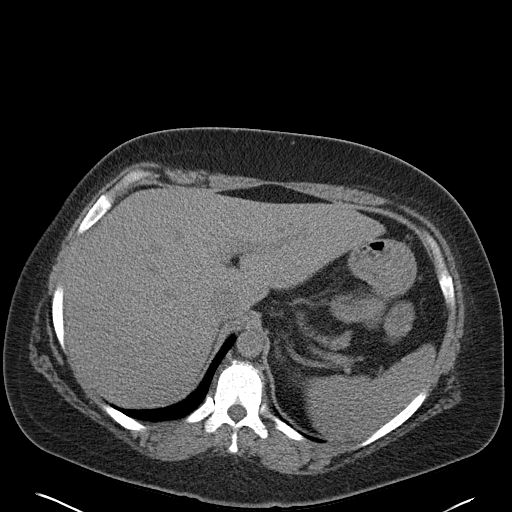
[im 83/96  soft-tissue]
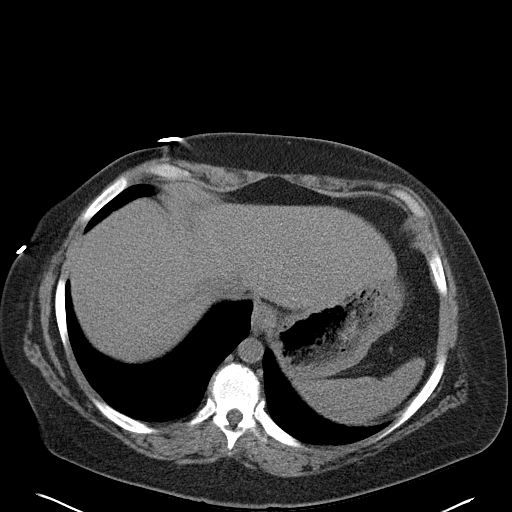
[im 89/96  soft-tissue]
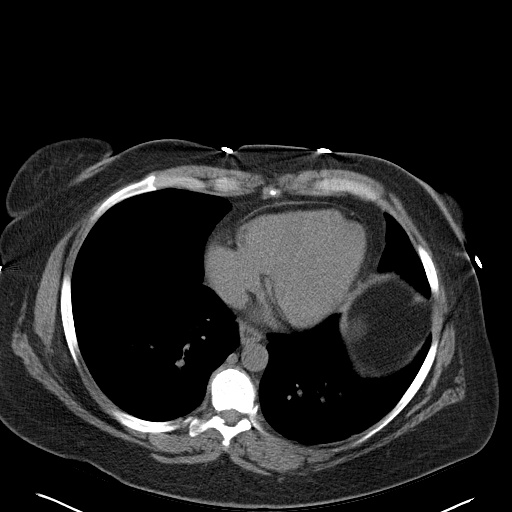

[Series 4: mpr coronal · coronal · 0.93mm/px · 3 of 119 slices shown]
[im 40/119  soft-tissue]
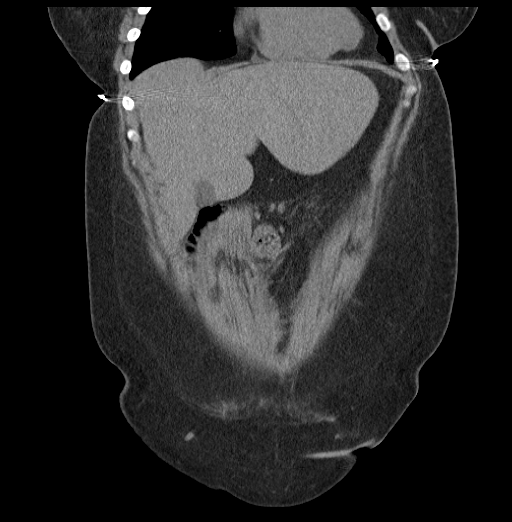
[im 53/119  soft-tissue]
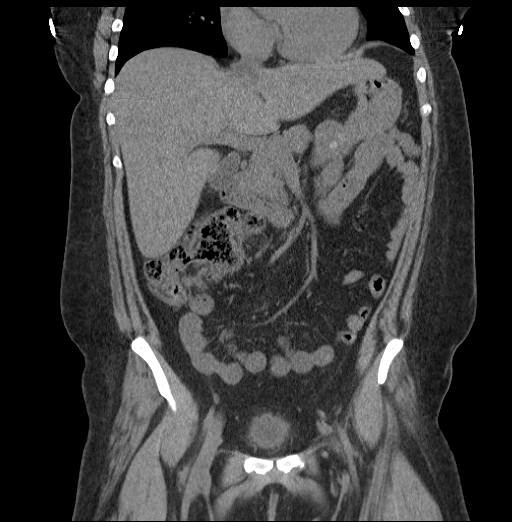
[im 66/119  soft-tissue]
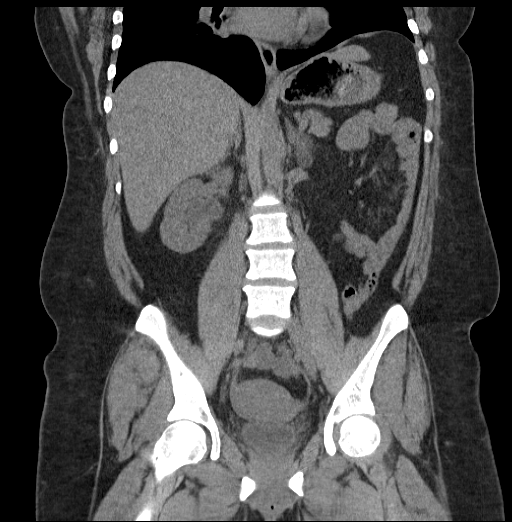

[17 of 46 positions shown; findings below may reference images not displayed]

FINDINGS: Lower Chest: Subsegmental atelectasis at the lung bases. Normal
heart size without pericardial or pleural effusion.

Abdomen/Pelvis: Normal noncontrast appearance of the liver, spleen,
stomach, pancreas, gallbladder, biliary tract, adrenal glands.

No renal calculi or hydronephrosis. No hydroureter or ureteric
calculi. Subtle edema about the right renal pelvis suspected on
image 40/series 2. No retroperitoneal or retrocrural adenopathy.

Redemonstration of partial colectomy with right-sided colostomy.
Apparent redundancy within the ostomy site suggests parastomal
hernia. Example image 45. Normal terminal ileum. No bowel
obstruction. No ascites.

No pelvic adenopathy. Edema surrounding the urinary bladder on image
79/series 2. Rectal stump. Facial thickening is also identified
about the posterior pelvis, including on image 77/series 2.
Otherwise normal appearance of the uterus. No adnexal mass. Trace
cul-de-sac fluid on image 77 is similar to on the prior exam and
nonspecific.

Bones/Musculoskeletal:  No acute osseous abnormality.
IMPRESSION: 1. No urinary tract calculi or hydronephrosis. Otherwise, low
sensitivity exam secondary to stone study technique.
2. Pelvic edema, favored to be centered about the urinary bladder.
Favor cystitis. Subtle edema about the right renal pelvis, for which
ascending infection cannot be excluded.
3. Left hemicolectomy with right-sided colostomy. Similar stomal
redundancy.
4. Mild edema throughout the remainder of the pelvis, nonspecific.
Can't exclude a component of pelvic inflammatory disease.
